# Patient Record
Sex: Female | Born: 1975 | Race: White | Hispanic: No | Marital: Married | Smoking: Never smoker
Health system: Southern US, Community
[De-identification: ages and names within clinical notes are randomized; demographics above are authoritative.]

## PROBLEM LIST (undated history)

## (undated) DIAGNOSIS — I83009 Varicose veins of unspecified lower extremity with ulcer of unspecified site: Secondary | ICD-10-CM

## (undated) DIAGNOSIS — R1013 Epigastric pain: Secondary | ICD-10-CM

## (undated) DIAGNOSIS — F329 Major depressive disorder, single episode, unspecified: Secondary | ICD-10-CM

## (undated) DIAGNOSIS — L97909 Non-pressure chronic ulcer of unspecified part of unspecified lower leg with unspecified severity: Secondary | ICD-10-CM

## (undated) DIAGNOSIS — I10 Essential (primary) hypertension: Secondary | ICD-10-CM

## (undated) DIAGNOSIS — F32A Depression, unspecified: Secondary | ICD-10-CM

## (undated) DIAGNOSIS — F419 Anxiety disorder, unspecified: Secondary | ICD-10-CM

## (undated) DIAGNOSIS — K219 Gastro-esophageal reflux disease without esophagitis: Secondary | ICD-10-CM

## (undated) DIAGNOSIS — E7800 Pure hypercholesterolemia, unspecified: Secondary | ICD-10-CM

## (undated) DIAGNOSIS — K625 Hemorrhage of anus and rectum: Secondary | ICD-10-CM

## (undated) DIAGNOSIS — E119 Type 2 diabetes mellitus without complications: Secondary | ICD-10-CM

## (undated) HISTORY — DX: Hemorrhage of anus and rectum: K62.5

## (undated) HISTORY — DX: Major depressive disorder, single episode, unspecified: F32.9

## (undated) HISTORY — DX: Depression, unspecified: F32.A

## (undated) HISTORY — DX: Pure hypercholesterolemia, unspecified: E78.00

## (undated) HISTORY — PX: CHOLECYSTECTOMY: SHX55

## (undated) HISTORY — DX: Epigastric pain: R10.13

## (undated) HISTORY — DX: Anxiety disorder, unspecified: F41.9

---

## 2014-08-11 ENCOUNTER — Encounter (HOSPITAL_COMMUNITY): Payer: Self-pay | Admitting: *Deleted

## 2014-08-11 ENCOUNTER — Emergency Department (HOSPITAL_COMMUNITY)
Admission: EM | Admit: 2014-08-11 | Discharge: 2014-08-11 | Disposition: A | Discharge status: Home / Self Care | Payer: Medicare Other | Attending: Emergency Medicine | Admitting: Emergency Medicine

## 2014-08-11 DIAGNOSIS — E1165 Type 2 diabetes mellitus with hyperglycemia: Secondary | ICD-10-CM | POA: Diagnosis not present

## 2014-08-11 DIAGNOSIS — Z9049 Acquired absence of other specified parts of digestive tract: Secondary | ICD-10-CM | POA: Diagnosis not present

## 2014-08-11 DIAGNOSIS — I10 Essential (primary) hypertension: Secondary | ICD-10-CM | POA: Diagnosis not present

## 2014-08-11 DIAGNOSIS — B373 Candidiasis of vulva and vagina: Secondary | ICD-10-CM | POA: Insufficient documentation

## 2014-08-11 DIAGNOSIS — K219 Gastro-esophageal reflux disease without esophagitis: Secondary | ICD-10-CM | POA: Diagnosis not present

## 2014-08-11 DIAGNOSIS — B3731 Acute candidiasis of vulva and vagina: Secondary | ICD-10-CM

## 2014-08-11 DIAGNOSIS — Z79899 Other long term (current) drug therapy: Secondary | ICD-10-CM | POA: Insufficient documentation

## 2014-08-11 DIAGNOSIS — R21 Rash and other nonspecific skin eruption: Secondary | ICD-10-CM | POA: Diagnosis present

## 2014-08-11 DIAGNOSIS — R739 Hyperglycemia, unspecified: Secondary | ICD-10-CM

## 2014-08-11 DIAGNOSIS — N898 Other specified noninflammatory disorders of vagina: Secondary | ICD-10-CM

## 2014-08-11 HISTORY — DX: Type 2 diabetes mellitus without complications: E11.9

## 2014-08-11 HISTORY — DX: Gastro-esophageal reflux disease without esophagitis: K21.9

## 2014-08-11 HISTORY — DX: Varicose veins of unspecified lower extremity with ulcer of unspecified site: L97.909

## 2014-08-11 HISTORY — DX: Varicose veins of unspecified lower extremity with ulcer of unspecified site: I83.009

## 2014-08-11 HISTORY — DX: Essential (primary) hypertension: I10

## 2014-08-11 LAB — URINALYSIS, ROUTINE W REFLEX MICROSCOPIC
BILIRUBIN URINE: NEGATIVE
KETONES UR: NEGATIVE mg/dL
Nitrite: NEGATIVE
PH: 5.5 (ref 5.0–8.0)
PROTEIN: NEGATIVE mg/dL
Specific Gravity, Urine: 1.037 — ABNORMAL HIGH (ref 1.005–1.030)
Urobilinogen, UA: 0.2 mg/dL (ref 0.0–1.0)

## 2014-08-11 LAB — URINE MICROSCOPIC-ADD ON

## 2014-08-11 LAB — BASIC METABOLIC PANEL
Anion gap: 11 (ref 5–15)
BUN: 13 mg/dL (ref 6–23)
CO2: 23 mmol/L (ref 19–32)
Calcium: 8.8 mg/dL (ref 8.4–10.5)
Chloride: 93 mmol/L — ABNORMAL LOW (ref 96–112)
Creatinine, Ser: 0.73 mg/dL (ref 0.50–1.10)
GFR calc Af Amer: >90 mL/min (ref >90)
GFR calc non Af Amer: >90 mL/min (ref >90)
Glucose, Bld: 488 mg/dL — ABNORMAL HIGH (ref 70–99)
Potassium: 3.5 mmol/L (ref 3.5–5.1)
Sodium: 127 mmol/L — ABNORMAL LOW (ref 135–145)

## 2014-08-11 LAB — CBC
HCT: 40.8 % (ref 36.0–46.0)
Hemoglobin: 13.5 g/dL (ref 12.0–15.0)
MCH: 27.1 pg (ref 26.0–34.0)
MCHC: 33.1 g/dL (ref 30.0–36.0)
MCV: 81.8 fL (ref 78.0–100.0)
Platelets: 268 10*3/uL (ref 150–400)
RBC: 4.99 MIL/uL (ref 3.87–5.11)
RDW: 14.6 % (ref 11.5–15.5)
WBC: 9 10*3/uL (ref 4.0–10.5)

## 2014-08-11 LAB — CBG MONITORING, ED
GLUCOSE-CAPILLARY: 507 mg/dL — AB (ref 70–99)
GLUCOSE-CAPILLARY: 380 mg/dL — AB (ref 70–99)

## 2014-08-11 LAB — WET PREP, GENITAL
Clue Cells Wet Prep HPF POC: NONE SEEN
Trich, Wet Prep: NONE SEEN

## 2014-08-11 LAB — GC/CHLAMYDIA PROBE AMP (~~LOC~~) NOT AT ARMC
CHLAMYDIA, DNA PROBE: NEGATIVE
Neisseria Gonorrhea: NEGATIVE

## 2014-08-11 LAB — PREGNANCY, URINE: Preg Test, Ur: NEGATIVE

## 2014-08-11 MED ORDER — SODIUM CHLORIDE 0.9 % IV BOLUS (SEPSIS): 1000.0000 mL | Freq: Once | INTRAVENOUS | Status: DC

## 2014-08-11 MED ORDER — SODIUM CHLORIDE 0.9 % IV BOLUS (SEPSIS)
1000.0000 mL | Freq: Once | INTRAVENOUS | Status: AC
  Administered 2014-08-11: 1000 mL via INTRAVENOUS

## 2014-08-11 MED ORDER — DIPHENHYDRAMINE HCL 25 MG PO CAPS
25.0000 mg | ORAL_CAPSULE | Freq: Once | ORAL | Status: AC
  Administered 2014-08-11: 25 mg via ORAL
  Filled 2014-08-11: qty 1

## 2014-08-11 MED ORDER — AZITHROMYCIN 250 MG PO TABS
1000.0000 mg | ORAL_TABLET | Freq: Once | ORAL | Status: AC
  Administered 2014-08-11: 1000 mg via ORAL
  Filled 2014-08-11: qty 4

## 2014-08-11 MED ORDER — NYSTATIN 100000 UNIT/GM EX CREA: TOPICAL_CREAM | CUTANEOUS | Status: DC

## 2014-08-11 MED ORDER — CEFTRIAXONE SODIUM 250 MG IJ SOLR
250.0000 mg | Freq: Once | INTRAMUSCULAR | Status: AC
  Administered 2014-08-11: 250 mg via INTRAMUSCULAR
  Filled 2014-08-11: qty 250

## 2014-08-11 MED ORDER — FLUCONAZOLE 150 MG PO TABS
150.0000 mg | ORAL_TABLET | Freq: Once | ORAL | Status: AC
  Administered 2014-08-11: 150 mg via ORAL
  Filled 2014-08-11: qty 1

## 2014-08-11 MED ORDER — LIDOCAINE HCL (PF) 1 % IJ SOLN
0.9000 mL | Freq: Once | INTRAMUSCULAR | Status: AC
  Administered 2014-08-11: 0.9 mL
  Filled 2014-08-11: qty 5

## 2014-08-11 NOTE — Discharge Instructions (Signed)
Please follow up with your primary care physician in 1-2 days. If you do not have one please call [facility] number listed above. Please take your medications as prescribed. Please use Nystatin for your rash. Please use your Metformin as prescribed. Please read all discharge instructions and return precautions.    Candidal Vulvovaginitis Candidal vulvovaginitis is an infection of the vagina and vulva. The vulva is the skin around the opening of the vagina. This may cause itching and discomfort in and around the vagina.  HOME CARE  Only take medicine as told by your doctor.  Do not have sex (intercourse) until the infection is healed or as told by your doctor.  Practice safe sex.  Tell your sex partner about your infection.  Do not douche or use tampons.  Wear cotton underwear. Do not wear tight pants or panty hose.  Eat yogurt. This may help treat and prevent yeast infections. GET HELP RIGHT AWAY IF:   You have a fever.  Your problems get worse during treatment or do not get better in 3 days.  You have discomfort, irritation, or itching in your vagina or vulva area.  You have pain after sex.  You start to get belly (abdominal) pain. MAKE SURE YOU:  Understand these instructions.  Will watch your condition.  Will get help right away if you are not doing well or get worse. Document Released: 09/16/2008 Document Revised: 06/25/2013 Document Reviewed: 09/16/2008 [facility] Patient Information 2015 [place]. This information is not intended to replace advice given to you by your health care provider. Make sure you discuss any questions you have with your health care provider. Hyperglycemia Hyperglycemia occurs when the glucose (sugar) in your blood is too high. Hyperglycemia can happen for many reasons, but it most often happens to people who do not know they have diabetes or are not managing their diabetes properly.  CAUSES  Whether you have  diabetes or not, there are other causes of hyperglycemia. Hyperglycemia can occur when you have diabetes, but it can also occur in other situations that you might not be as aware of, such as: Diabetes  If you have diabetes and are having problems controlling your blood glucose, hyperglycemia could occur because of some of the following reasons:  Not following your meal plan.  Not taking your diabetes medications or not taking it properly.  Exercising less or doing less activity than you normally do.  Being sick. Pre-diabetes  This cannot be ignored. Before people develop Type 2 diabetes, they almost always have "pre-diabetes." This is when your blood glucose levels are higher than normal, but not yet high enough to be diagnosed as diabetes. Research has shown that some long-term damage to the body, especially the heart and circulatory system, may already be occurring during pre-diabetes. If you take action to manage your blood glucose when you have pre-diabetes, you may delay or prevent Type 2 diabetes from developing. Stress  If you have diabetes, you may be "diet" controlled or on oral medications or insulin to control your diabetes. However, you may find that your blood glucose is higher than usual in the hospital whether you have diabetes or not. This is often referred to as "stress hyperglycemia." Stress can elevate your blood glucose. This happens because of hormones put out by the body during times of stress. If stress has been the cause of your high blood glucose, it can be followed regularly by your caregiver. That way he/she can make sure  your hyperglycemia does not continue to get worse or progress to diabetes. Steroids  Steroids are medications that act on the infection fighting system (immune system) to block inflammation or infection. One side effect can be a rise in blood glucose. Most people can produce enough extra insulin to allow for this rise, but for those who cannot,  steroids make blood glucose levels go even higher. It is not unusual for steroid treatments to "uncover" diabetes that is developing. It is not always possible to determine if the hyperglycemia will go away after the steroids are stopped. A special blood test called an A1c is sometimes done to determine if your blood glucose was elevated before the steroids were started. SYMPTOMS  Thirsty.  Frequent urination.  Dry mouth.  Blurred vision.  Tired or fatigue.  Weakness.  Sleepy.  Tingling in feet or leg. DIAGNOSIS  Diagnosis is made by monitoring blood glucose in one or all of the following ways:  A1c test. This is a chemical found in your blood.  Fingerstick blood glucose monitoring.  Laboratory results. TREATMENT  First, knowing the cause of the hyperglycemia is important before the hyperglycemia can be treated. Treatment may include, but is not be limited to:  Education.  Change or adjustment in medications.  Change or adjustment in meal plan.  Treatment for an illness, infection, etc.  More frequent blood glucose monitoring.  Change in exercise plan.  Decreasing or stopping steroids.  Lifestyle changes. HOME CARE INSTRUCTIONS   Test your blood glucose as directed.  Exercise regularly. Your caregiver will give you instructions about exercise. Pre-diabetes or diabetes which comes on with stress is helped by exercising.  Eat wholesome, balanced meals. Eat often and at regular, fixed times. Your caregiver or nutritionist will give you a meal plan to guide your sugar intake.  Being at an ideal weight is important. If needed, losing as little as 10 to 15 pounds may help improve blood glucose levels. SEEK MEDICAL CARE IF:   You have questions about medicine, activity, or diet.  You continue to have symptoms (problems such as increased thirst, urination, or weight gain). SEEK IMMEDIATE MEDICAL CARE IF:   You are vomiting or have diarrhea.  Your breath smells  fruity.  You are breathing faster or slower.  You are very sleepy or incoherent.  You have numbness, tingling, or pain in your feet or hands.  You have chest pain.  Your symptoms get worse even though you have been following your caregiver's orders.  If you have any other questions or concerns. Document Released: 12/14/2000 Document Revised: 09/12/2011 Document Reviewed: 10/17/2011 [facility] Patient Information 2015 [place], [place]. This information is not intended to replace advice given to you by your health care provider. Make sure you discuss any questions you have with your health care provider.

## 2014-08-11 NOTE — ED Notes (Signed)
Pt reports rectal irritation and burning that began last Monday, progressively worse - pt unsure if there is a rash to the rectal area. Also c/o dry mouth and a dry/swollen upper lip. Pt used corn starch to the area w/o relief.

## 2014-08-11 NOTE — ED Provider Notes (Signed)
CSN: [id number]     Arrival date & time 08/11/14  0028 History   First MD Initiated Contact with Patient 08/11/14 0100     Chief Complaint  Patient presents with  . Rash     (Consider location/radiation/quality/duration/timing/severity/associated sxs/prior Treatment) HPI Comments: Patient is a 39 year old female past medical history significant for hypertension, DM, GERD presenting to the emergency department for multiple complaints. Patient's first complaint is a pruritic rash to the ano-genital area that has been worsening over the last week. She states the redness and itching have become more intense. She has tried corn starch, benadryl, and "edible" lotion with no improvement.   She is also complaining of dry mouth, polyuria, polydipsia x 1 week. Patient states she typically checks her blood sugar at home every 2-3 days and her glucose levels are typically in the 100s-200s. She denies missing any doses of her metformin.  Patient is also complaining of white foul-smelling vaginal discharge for the last few days. Denies any recent unprotected sexual intercourse. She states her last menstrual period was the beginning of January. No history of pregnancies.  Patient is also complaining of one week of upper lip swelling. Denies any shortness of breath, tongue swelling, difficulty breathing, difficulty swallowing, nausea, vomiting, diarrhea. No new medications, soaps, detergents etc.  Patient is a 39 y.o. female presenting with rash. The history is provided by the patient.  Rash Location:  Ano-genital Ano-genital rash location:  Gluteal cleft, groin, vagina, vulva and perineum Quality: burning, dryness, itchiness and redness   Quality: not blistering, not draining, not scaling, not swelling and not weeping   Associated symptoms: no abdominal pain, no diarrhea, no nausea and not vomiting     Past Medical History  Diagnosis Date  . Venous stasis ulcer   . Diabetes mellitus without  complication   . Hypertension   . GERD (gastroesophageal reflux disease)    Past Surgical History  Procedure Laterality Date  . Cholecystectomy     History reviewed. No pertinent family history. History  Substance Use Topics  . Smoking status: Never Smoker   . Smokeless tobacco: Not on file  . Alcohol Use: No   OB History    No data available     Review of Systems  Gastrointestinal: Negative for nausea, vomiting, abdominal pain and diarrhea.  Genitourinary: Positive for vaginal discharge.  Skin: Positive for rash.  All other systems reviewed and are negative.     Allergies  Other  Home Medications   Prior to Admission medications   Medication Sig Start Date End Date Taking? Authorizing Provider  FLUoxetine (PROZAC) 20 MG capsule Take 20 mg by mouth daily.   Yes Historical Provider, MD  HYDROcodone-acetaminophen (NORCO/VICODIN) 5-325 MG per tablet Take 1 tablet by mouth every 6 (six) hours as needed for moderate pain.   Yes Historical Provider, MD  metFORMIN (GLUCOPHAGE) 500 MG tablet Take 500 mg by mouth 2 (two) times daily with a meal.   Yes Historical Provider, MD  pantoprazole (PROTONIX) 20 MG tablet Take 20 mg by mouth daily.   Yes Historical Provider, MD  pravastatin (PRAVACHOL) 10 MG tablet Take 10 mg by mouth daily.   Yes Historical Provider, MD  nystatin cream (MYCOSTATIN) Apply to affected area 2 times daily 08/11/14   Victorino Dike L Jamicah Anstead, PA-C   BP 114/67 mmHg  Pulse 95  Temp(Src) 97.6 F (36.4 C) (Oral)  Resp 18  Ht  (1.753 m)  SpO2 98%  LMP 07/11/2014 (Approximate) Physical Exam  Constitutional:  She is oriented to person, place, and time. She appears well-developed and well-nourished. No distress.  HENT:  Head: Normocephalic and atraumatic.  Right Ear: External ear normal.  Left Ear: External ear normal.  Nose: Nose normal.  Mouth/Throat: Oropharynx is clear and moist.  No angioedema.  Eyes: Conjunctivae are normal.  Neck: Normal range of  motion. Neck supple.  Cardiovascular: Normal rate, regular rhythm and normal heart sounds.   Pulmonary/Chest: Effort normal and breath sounds normal. No respiratory distress.  Abdominal: Soft. Bowel sounds are normal. She exhibits no distension. There is no tenderness. There is no rebound and no guarding.  Musculoskeletal: Normal range of motion.  Neurological: She is alert and oriented to person, place, and time.  Skin: Skin is warm and dry. Rash (vulvovaginal erythema ) noted. No petechiae and no purpura noted. Rash is not nodular, not pustular, not vesicular and not urticarial. She is not diaphoretic.  Psychiatric: She has a normal mood and affect.  Nursing note and vitals reviewed.  Exam performed by Francee PiccoloPIEPENBRINK, Byrdie Miyazaki L,  exam chaperoned Date: 08/11/2014 Pelvic exam: normal external genitalia without evidence of trauma. VULVA: normal appearing vulva with no masses, tenderness or lesion. VAGINA: normal appearing vagina with normal color and discharge, no lesions. CERVIX: normal appearing cervix without lesions, cervical motion tenderness absent, cervical os closed with out purulent discharge; vaginal discharge - white and thick, Wet prep and DNA probe for chlamydia and GC obtained.   ADNEXA: normal adnexa in size, nontender and no masses UTERUS: uterus is normal size, shape, consistency and nontender.   ED Course  Procedures (including critical care time) Medications  diphenhydrAMINE (BENADRYL) capsule 25 mg (25 mg Oral Given 08/11/14 0235)  sodium chloride 0.9 % bolus 1,000 mL (0 mLs Intravenous Stopped 08/11/14 0400)  fluconazole (DIFLUCAN) tablet 150 mg (150 mg Oral Given 08/11/14 0440)  cefTRIAXone (ROCEPHIN) injection 250 mg (250 mg Intramuscular Given 08/11/14 0440)  azithromycin (ZITHROMAX) tablet 1,000 mg (1,000 mg Oral Given 08/11/14 0440)  lidocaine (PF) (XYLOCAINE) 1 % injection 0.9 mL (0.9 mLs Other Given 08/11/14 0441)    Labs Review Labs Reviewed  WET PREP, GENITAL -  Abnormal; Notable for the following:    Yeast Wet Prep HPF POC FEW (*)    WBC, Wet Prep HPF POC MODERATE (*)    All other components within normal limits  URINALYSIS, ROUTINE W REFLEX MICROSCOPIC - Abnormal; Notable for the following:    APPearance HAZY (*)    Specific Gravity, Urine 1.037 (*)    Glucose, UA >1000 (*)    Hgb urine dipstick SMALL (*)    Leukocytes, UA SMALL (*)    All other components within normal limits  BASIC METABOLIC PANEL - Abnormal; Notable for the following:    Sodium 127 (*)    Chloride 93 (*)    Glucose, Bld 488 (*)    All other components within normal limits  CBG MONITORING, ED - Abnormal; Notable for the following:    Glucose-Capillary 507 (*)    All other components within normal limits  CBG MONITORING, ED - Abnormal; Notable for the following:    Glucose-Capillary 380 (*)    All other components within normal limits  PREGNANCY, URINE  CBC  URINE MICROSCOPIC-ADD ON  GC/CHLAMYDIA PROBE AMP ([facility])    Imaging Review No results found.   EKG Interpretation None      MDM   Final diagnoses:  Hyperglycemia without ketosis  Vulvovaginal candidiasis  Vaginal discharge    Filed Vitals:  08/11/14 0503  BP: 114/67  Pulse: 95  Temp:   Resp: 18   Afebrile, NAD, non-toxic appearing, AAOx4.  I have reviewed nursing notes, vital signs, and all appropriate lab and imaging results for this patient.  1) Vulvovaginal candidiasis: Patient with candidal infection will prescribe nystatin for external infection, we'll give 150 mg of Diflucan here in emergency department for vaginal candidal infection. Advised OB/GYN follow-up.  2) Hyperglycemia: Patient noted to be hyperglycemic in emergency department. He had Within normal limits. No ketones in urine. Glucose decreased in response to IV fluids. Discussed proper control at home with the use of indications as prescribed along with dietary changes.  3) Vaginal discharge: Patient to be discharged  with instructions to follow up with OBGYN. Pt understands GC/Chlamydia cultures pending and that they will need to inform all sexual partners within the last 6 months if results return positive. Pt has been treated prophylacticly with azithromycin and rocephin due to pts history, pelvic exam, and wet prep with increased WBCs. Pt advised that she will receive a call in 48 hours if the test is positive and to refrain from sexual activity for 48 hours. If the test is positive, pt is advised to refrain from sexual activity for 10 days for the medicine to take effect.  Pt not concerning for PID because hemodynamically stable and no cervical motion tenderness on pelvic exam.  Return precautions discussed. Patient is agreeable to plan. Patient is stable at time of discharge.      Jeannetta Ellis, PA-C 08/11/14 [id number]  Gwyneth Sprout, MD 08/11/14 [phone]

## 2014-08-11 NOTE — ED Notes (Signed)
Pt had no adverse reaction to ABT IM.

## 2014-08-11 NOTE — ED Notes (Signed)
Awake. Verbally responsive. A/O x4. Resp even and unlabored. No audible adventitious breath sounds noted. ABC's intact.  

## 2014-09-18 ENCOUNTER — Encounter (HOSPITAL_BASED_OUTPATIENT_CLINIC_OR_DEPARTMENT_OTHER): Payer: Medicare Other | Attending: Internal Medicine

## 2014-09-18 DIAGNOSIS — L97211 Non-pressure chronic ulcer of right calf limited to breakdown of skin: Secondary | ICD-10-CM | POA: Insufficient documentation

## 2014-09-18 DIAGNOSIS — E1165 Type 2 diabetes mellitus with hyperglycemia: Secondary | ICD-10-CM | POA: Insufficient documentation

## 2014-09-18 DIAGNOSIS — E1151 Type 2 diabetes mellitus with diabetic peripheral angiopathy without gangrene: Secondary | ICD-10-CM | POA: Diagnosis not present

## 2014-09-18 LAB — GLUCOSE, CAPILLARY: Glucose-Capillary: 394 mg/dL — ABNORMAL HIGH (ref 70–99)

## 2014-09-24 ENCOUNTER — Ambulatory Visit (HOSPITAL_COMMUNITY)
Admission: RE | Admit: 2014-09-24 | Discharge: 2014-09-24 | Disposition: A | Discharge status: Home / Self Care | Payer: Medicare Other | Source: Ambulatory Visit | Attending: Vascular Surgery | Admitting: Vascular Surgery

## 2014-09-24 ENCOUNTER — Other Ambulatory Visit: Payer: Self-pay | Admitting: Internal Medicine

## 2014-09-24 DIAGNOSIS — L97211 Non-pressure chronic ulcer of right calf limited to breakdown of skin: Secondary | ICD-10-CM

## 2014-09-24 DIAGNOSIS — I10 Essential (primary) hypertension: Secondary | ICD-10-CM | POA: Insufficient documentation

## 2014-09-24 DIAGNOSIS — E785 Hyperlipidemia, unspecified: Secondary | ICD-10-CM | POA: Diagnosis not present

## 2014-09-24 DIAGNOSIS — E119 Type 2 diabetes mellitus without complications: Secondary | ICD-10-CM | POA: Diagnosis not present

## 2014-09-25 DIAGNOSIS — L97211 Non-pressure chronic ulcer of right calf limited to breakdown of skin: Secondary | ICD-10-CM | POA: Diagnosis not present

## 2014-09-25 DIAGNOSIS — E1165 Type 2 diabetes mellitus with hyperglycemia: Secondary | ICD-10-CM | POA: Diagnosis not present

## 2014-09-25 DIAGNOSIS — E1151 Type 2 diabetes mellitus with diabetic peripheral angiopathy without gangrene: Secondary | ICD-10-CM | POA: Diagnosis not present

## 2014-09-25 LAB — GLUCOSE, CAPILLARY: GLUCOSE-CAPILLARY: 510 mg/dL — AB (ref 70–99)

## 2014-10-02 DIAGNOSIS — E1165 Type 2 diabetes mellitus with hyperglycemia: Secondary | ICD-10-CM | POA: Diagnosis not present

## 2014-10-02 DIAGNOSIS — E1151 Type 2 diabetes mellitus with diabetic peripheral angiopathy without gangrene: Secondary | ICD-10-CM | POA: Diagnosis not present

## 2014-10-02 DIAGNOSIS — L97211 Non-pressure chronic ulcer of right calf limited to breakdown of skin: Secondary | ICD-10-CM | POA: Diagnosis not present

## 2014-10-09 ENCOUNTER — Encounter (HOSPITAL_BASED_OUTPATIENT_CLINIC_OR_DEPARTMENT_OTHER): Payer: Medicare Other | Attending: Internal Medicine

## 2014-10-09 DIAGNOSIS — E11622 Type 2 diabetes mellitus with other skin ulcer: Secondary | ICD-10-CM | POA: Insufficient documentation

## 2014-10-09 DIAGNOSIS — I872 Venous insufficiency (chronic) (peripheral): Secondary | ICD-10-CM | POA: Insufficient documentation

## 2014-10-09 DIAGNOSIS — L97211 Non-pressure chronic ulcer of right calf limited to breakdown of skin: Secondary | ICD-10-CM | POA: Diagnosis not present

## 2014-10-09 DIAGNOSIS — E1151 Type 2 diabetes mellitus with diabetic peripheral angiopathy without gangrene: Secondary | ICD-10-CM | POA: Insufficient documentation

## 2014-10-09 DIAGNOSIS — I10 Essential (primary) hypertension: Secondary | ICD-10-CM | POA: Diagnosis not present

## 2014-10-09 DIAGNOSIS — J45909 Unspecified asthma, uncomplicated: Secondary | ICD-10-CM | POA: Diagnosis not present

## 2014-10-09 LAB — GLUCOSE, CAPILLARY: Glucose-Capillary: 368 mg/dL — ABNORMAL HIGH (ref 70–99)

## 2014-10-16 DIAGNOSIS — L97211 Non-pressure chronic ulcer of right calf limited to breakdown of skin: Secondary | ICD-10-CM | POA: Diagnosis not present

## 2014-10-16 DIAGNOSIS — E11622 Type 2 diabetes mellitus with other skin ulcer: Secondary | ICD-10-CM | POA: Diagnosis not present

## 2014-10-16 DIAGNOSIS — E1151 Type 2 diabetes mellitus with diabetic peripheral angiopathy without gangrene: Secondary | ICD-10-CM | POA: Diagnosis not present

## 2014-10-16 DIAGNOSIS — I872 Venous insufficiency (chronic) (peripheral): Secondary | ICD-10-CM | POA: Diagnosis not present

## 2014-10-23 DIAGNOSIS — E1151 Type 2 diabetes mellitus with diabetic peripheral angiopathy without gangrene: Secondary | ICD-10-CM | POA: Diagnosis not present

## 2014-10-23 DIAGNOSIS — L97211 Non-pressure chronic ulcer of right calf limited to breakdown of skin: Secondary | ICD-10-CM | POA: Diagnosis not present

## 2014-10-23 DIAGNOSIS — I872 Venous insufficiency (chronic) (peripheral): Secondary | ICD-10-CM | POA: Diagnosis not present

## 2014-10-23 DIAGNOSIS — E11622 Type 2 diabetes mellitus with other skin ulcer: Secondary | ICD-10-CM | POA: Diagnosis not present

## 2014-10-30 DIAGNOSIS — I872 Venous insufficiency (chronic) (peripheral): Secondary | ICD-10-CM | POA: Diagnosis not present

## 2014-10-31 ENCOUNTER — Encounter: Payer: Medicare Other | Attending: Family Medicine | Admitting: Skilled Nursing Facility1

## 2014-10-31 ENCOUNTER — Encounter: Payer: Self-pay | Admitting: Skilled Nursing Facility1

## 2014-10-31 VITALS — Ht 69.0 in | Wt 259.0 lb

## 2014-10-31 DIAGNOSIS — Z713 Dietary counseling and surveillance: Secondary | ICD-10-CM | POA: Insufficient documentation

## 2014-10-31 DIAGNOSIS — E119 Type 2 diabetes mellitus without complications: Secondary | ICD-10-CM | POA: Diagnosis not present

## 2014-10-31 NOTE — Progress Notes (Signed)
Diabetes Self-Management Education  Visit Type:    Appt. Start Time: 10:30 Appt. End Time: 12:00  10/31/2014  Ms. Taylor Patterson, identified by name and date of birth, is a 39 y.o. female with a diagnosis of Diabetes: Type 2.  Other people present during visit:      ASSESSMENT  Height 5\' 9"  (1.753 m), weight 259 lb (117.482 kg). Body mass index is 38.23 kg/(m^2).   Pt states she has had the leg ulcer for 10 years and has not seen it heal. Pt states it hinders her from being as active as she wants to be. Pt states she was 300 pounds but lost wt due to little nutritional changes. Pt states she has had blurred vision every day for a month and has had a headache every day for a week.   Initial Visit Information:  Are you currently following a meal plan?: No   Are you taking your medications as prescribed?: Yes Are you checking your feet?: Yes How many days per week are you checking your feet?: 7      Psychosocial:     Patient Belief/Attitude about Diabetes: Denial Self-care barriers: None Self-management support: Family Patient Concerns: Nutrition/Meal planning, Healthy Lifestyle, Weight Control Special Needs: None Preferred Learning Style: Auditory Learning Readiness: Contemplating  Complications:   Last HgB A1C per patient/outside source: 16 mg/dL How often do you check your blood sugar?: 1-2 times/day Fasting Blood glucose range (mg/dL): 81-19170-129 Postprandial Blood glucose range (mg/dL): [phone] Number of hypoglycemic episodes per month: 0 Number of hyperglycemic episodes per week: 1 Have you had a dilated eye exam in the past 12 months?: No Have you had a dental exam in the past 12 months?: No  Diet Intake:  Breakfast: toast, orange Snack (morning): fruit or nuts Lunch: salad (lettuce, spinach, cucmber, carrot, tomato) Snack (afternoon): fruit or nuts Dinner: chicken, green beans or lima beans Snack (evening): fruit or nuts Beverage(s): avoids water, half and  half tea,   Exercise:  Exercise: ADL's, Light (walking / raking leaves) (Leg ulcer prevents her from more walking)  Individualized Plan for Diabetes Self-Management Training:   Learning Objective:  Patient will have a greater understanding of diabetes self-management.  Patient education plan per assessed needs and concerns is to attend individual sessions for     Education Topics Reviewed with Patient Today:  Definition of diabetes, type 1 and 2, and the diagnosis of diabetes Role of diet in the treatment of diabetes and the relationship between the three main macronutrients and blood glucose level, Food label reading, portion sizes and measuring food., Carbohydrate counting Role of exercise on diabetes management, blood pressure control and cardiac health., Identified with patient nutritional and/or medication changes necessary with exercise.   Daily foot exams, Yearly dilated eye exam Discussed and identified patients' treatment of hyperglycemia. Relationship between chronic complications and blood glucose control, Dental care, Retinopathy and reason for yearly dilated eye exams        PATIENTS GOALS/Plan (Developed by the patient):  Nutrition: Follow meal plan discussed, General guidelines for healthy choices and portions discussed, Adjust meds/carbs with exercise as discussed Physical Activity: Exercise 5-7 days per week  Plan:   There are no Patient Instructions on file for this visit.  Expected Outcomes:  Demonstrated interest in learning. Expect positive outcomes  Education material provided: Living Well with Diabetes, Meal plan card and Snack sheet  If problems or questions, patient to contact team via:  Phone  Future DSME appointment: PRN

## 2014-11-06 ENCOUNTER — Encounter (HOSPITAL_BASED_OUTPATIENT_CLINIC_OR_DEPARTMENT_OTHER): Payer: Medicare Other | Attending: Internal Medicine

## 2014-11-06 DIAGNOSIS — L97211 Non-pressure chronic ulcer of right calf limited to breakdown of skin: Secondary | ICD-10-CM | POA: Insufficient documentation

## 2014-11-06 DIAGNOSIS — I872 Venous insufficiency (chronic) (peripheral): Secondary | ICD-10-CM | POA: Diagnosis not present

## 2014-11-13 DIAGNOSIS — I872 Venous insufficiency (chronic) (peripheral): Secondary | ICD-10-CM | POA: Diagnosis not present

## 2014-11-20 DIAGNOSIS — I872 Venous insufficiency (chronic) (peripheral): Secondary | ICD-10-CM | POA: Diagnosis not present

## 2014-11-20 DIAGNOSIS — L97211 Non-pressure chronic ulcer of right calf limited to breakdown of skin: Secondary | ICD-10-CM | POA: Diagnosis not present

## 2014-11-27 DIAGNOSIS — I872 Venous insufficiency (chronic) (peripheral): Secondary | ICD-10-CM | POA: Diagnosis not present

## 2014-12-04 ENCOUNTER — Encounter (HOSPITAL_BASED_OUTPATIENT_CLINIC_OR_DEPARTMENT_OTHER): Payer: Medicare Other | Attending: Internal Medicine

## 2014-12-04 DIAGNOSIS — I10 Essential (primary) hypertension: Secondary | ICD-10-CM | POA: Diagnosis not present

## 2014-12-04 DIAGNOSIS — J45909 Unspecified asthma, uncomplicated: Secondary | ICD-10-CM | POA: Diagnosis not present

## 2014-12-04 DIAGNOSIS — L97211 Non-pressure chronic ulcer of right calf limited to breakdown of skin: Secondary | ICD-10-CM | POA: Diagnosis not present

## 2014-12-04 DIAGNOSIS — E1151 Type 2 diabetes mellitus with diabetic peripheral angiopathy without gangrene: Secondary | ICD-10-CM | POA: Insufficient documentation

## 2014-12-04 DIAGNOSIS — I872 Venous insufficiency (chronic) (peripheral): Secondary | ICD-10-CM | POA: Insufficient documentation

## 2014-12-04 DIAGNOSIS — E11622 Type 2 diabetes mellitus with other skin ulcer: Secondary | ICD-10-CM | POA: Insufficient documentation

## 2014-12-11 DIAGNOSIS — E1151 Type 2 diabetes mellitus with diabetic peripheral angiopathy without gangrene: Secondary | ICD-10-CM | POA: Diagnosis not present

## 2014-12-18 DIAGNOSIS — E1151 Type 2 diabetes mellitus with diabetic peripheral angiopathy without gangrene: Secondary | ICD-10-CM | POA: Diagnosis not present

## 2014-12-18 DIAGNOSIS — L97211 Non-pressure chronic ulcer of right calf limited to breakdown of skin: Secondary | ICD-10-CM | POA: Diagnosis not present

## 2014-12-18 DIAGNOSIS — E11622 Type 2 diabetes mellitus with other skin ulcer: Secondary | ICD-10-CM | POA: Diagnosis not present

## 2014-12-18 DIAGNOSIS — I872 Venous insufficiency (chronic) (peripheral): Secondary | ICD-10-CM | POA: Diagnosis not present

## 2014-12-25 DIAGNOSIS — E1151 Type 2 diabetes mellitus with diabetic peripheral angiopathy without gangrene: Secondary | ICD-10-CM | POA: Diagnosis not present

## 2014-12-25 DIAGNOSIS — E11622 Type 2 diabetes mellitus with other skin ulcer: Secondary | ICD-10-CM | POA: Diagnosis not present

## 2014-12-25 DIAGNOSIS — L97211 Non-pressure chronic ulcer of right calf limited to breakdown of skin: Secondary | ICD-10-CM | POA: Diagnosis not present

## 2014-12-25 DIAGNOSIS — I872 Venous insufficiency (chronic) (peripheral): Secondary | ICD-10-CM | POA: Diagnosis not present

## 2015-01-01 DIAGNOSIS — E11622 Type 2 diabetes mellitus with other skin ulcer: Secondary | ICD-10-CM | POA: Diagnosis not present

## 2015-01-01 DIAGNOSIS — L97211 Non-pressure chronic ulcer of right calf limited to breakdown of skin: Secondary | ICD-10-CM | POA: Diagnosis not present

## 2015-01-01 DIAGNOSIS — E1151 Type 2 diabetes mellitus with diabetic peripheral angiopathy without gangrene: Secondary | ICD-10-CM | POA: Diagnosis not present

## 2015-01-01 DIAGNOSIS — I872 Venous insufficiency (chronic) (peripheral): Secondary | ICD-10-CM | POA: Diagnosis not present

## 2015-01-08 ENCOUNTER — Encounter (HOSPITAL_BASED_OUTPATIENT_CLINIC_OR_DEPARTMENT_OTHER): Payer: Commercial Managed Care - HMO | Attending: Internal Medicine

## 2015-01-08 DIAGNOSIS — I10 Essential (primary) hypertension: Secondary | ICD-10-CM | POA: Diagnosis not present

## 2015-01-08 DIAGNOSIS — I739 Peripheral vascular disease, unspecified: Secondary | ICD-10-CM | POA: Insufficient documentation

## 2015-01-08 DIAGNOSIS — E0851 Diabetes mellitus due to underlying condition with diabetic peripheral angiopathy without gangrene: Secondary | ICD-10-CM | POA: Diagnosis not present

## 2015-01-08 DIAGNOSIS — L03115 Cellulitis of right lower limb: Secondary | ICD-10-CM | POA: Diagnosis not present

## 2015-01-08 DIAGNOSIS — L97211 Non-pressure chronic ulcer of right calf limited to breakdown of skin: Secondary | ICD-10-CM | POA: Insufficient documentation

## 2015-01-13 DIAGNOSIS — L98499 Non-pressure chronic ulcer of skin of other sites with unspecified severity: Secondary | ICD-10-CM | POA: Diagnosis not present

## 2015-01-13 DIAGNOSIS — E1165 Type 2 diabetes mellitus with hyperglycemia: Secondary | ICD-10-CM | POA: Diagnosis not present

## 2015-01-13 DIAGNOSIS — E785 Hyperlipidemia, unspecified: Secondary | ICD-10-CM | POA: Diagnosis not present

## 2015-01-13 DIAGNOSIS — F329 Major depressive disorder, single episode, unspecified: Secondary | ICD-10-CM | POA: Diagnosis not present

## 2015-01-13 DIAGNOSIS — R7989 Other specified abnormal findings of blood chemistry: Secondary | ICD-10-CM | POA: Diagnosis not present

## 2015-01-13 DIAGNOSIS — E669 Obesity, unspecified: Secondary | ICD-10-CM | POA: Diagnosis not present

## 2015-01-15 DIAGNOSIS — E0851 Diabetes mellitus due to underlying condition with diabetic peripheral angiopathy without gangrene: Secondary | ICD-10-CM | POA: Diagnosis not present

## 2015-01-15 DIAGNOSIS — I10 Essential (primary) hypertension: Secondary | ICD-10-CM | POA: Diagnosis not present

## 2015-01-15 DIAGNOSIS — L97211 Non-pressure chronic ulcer of right calf limited to breakdown of skin: Secondary | ICD-10-CM | POA: Diagnosis not present

## 2015-01-15 DIAGNOSIS — L03115 Cellulitis of right lower limb: Secondary | ICD-10-CM | POA: Diagnosis not present

## 2015-01-15 DIAGNOSIS — I739 Peripheral vascular disease, unspecified: Secondary | ICD-10-CM | POA: Diagnosis not present

## 2015-01-20 DIAGNOSIS — L259 Unspecified contact dermatitis, unspecified cause: Secondary | ICD-10-CM | POA: Diagnosis not present

## 2015-01-22 DIAGNOSIS — I10 Essential (primary) hypertension: Secondary | ICD-10-CM | POA: Diagnosis not present

## 2015-01-22 DIAGNOSIS — I739 Peripheral vascular disease, unspecified: Secondary | ICD-10-CM | POA: Diagnosis not present

## 2015-01-22 DIAGNOSIS — L03115 Cellulitis of right lower limb: Secondary | ICD-10-CM | POA: Diagnosis not present

## 2015-01-22 DIAGNOSIS — L97211 Non-pressure chronic ulcer of right calf limited to breakdown of skin: Secondary | ICD-10-CM | POA: Diagnosis not present

## 2015-01-22 DIAGNOSIS — E0851 Diabetes mellitus due to underlying condition with diabetic peripheral angiopathy without gangrene: Secondary | ICD-10-CM | POA: Diagnosis not present

## 2015-01-29 DIAGNOSIS — E0851 Diabetes mellitus due to underlying condition with diabetic peripheral angiopathy without gangrene: Secondary | ICD-10-CM | POA: Diagnosis not present

## 2015-01-29 DIAGNOSIS — L03115 Cellulitis of right lower limb: Secondary | ICD-10-CM | POA: Diagnosis not present

## 2015-01-29 DIAGNOSIS — L97211 Non-pressure chronic ulcer of right calf limited to breakdown of skin: Secondary | ICD-10-CM | POA: Diagnosis not present

## 2015-01-29 DIAGNOSIS — F331 Major depressive disorder, recurrent, moderate: Secondary | ICD-10-CM | POA: Diagnosis not present

## 2015-01-29 DIAGNOSIS — I10 Essential (primary) hypertension: Secondary | ICD-10-CM | POA: Diagnosis not present

## 2015-01-29 DIAGNOSIS — I739 Peripheral vascular disease, unspecified: Secondary | ICD-10-CM | POA: Diagnosis not present

## 2015-02-05 ENCOUNTER — Encounter (HOSPITAL_BASED_OUTPATIENT_CLINIC_OR_DEPARTMENT_OTHER): Payer: Commercial Managed Care - HMO | Attending: Internal Medicine

## 2015-02-05 DIAGNOSIS — E11622 Type 2 diabetes mellitus with other skin ulcer: Secondary | ICD-10-CM | POA: Insufficient documentation

## 2015-02-05 DIAGNOSIS — Z6838 Body mass index (BMI) 38.0-38.9, adult: Secondary | ICD-10-CM | POA: Insufficient documentation

## 2015-02-05 DIAGNOSIS — E1151 Type 2 diabetes mellitus with diabetic peripheral angiopathy without gangrene: Secondary | ICD-10-CM | POA: Insufficient documentation

## 2015-02-05 DIAGNOSIS — J45909 Unspecified asthma, uncomplicated: Secondary | ICD-10-CM | POA: Insufficient documentation

## 2015-02-05 DIAGNOSIS — F331 Major depressive disorder, recurrent, moderate: Secondary | ICD-10-CM | POA: Diagnosis not present

## 2015-02-05 DIAGNOSIS — E669 Obesity, unspecified: Secondary | ICD-10-CM | POA: Insufficient documentation

## 2015-02-05 DIAGNOSIS — I83018 Varicose veins of right lower extremity with ulcer other part of lower leg: Secondary | ICD-10-CM | POA: Diagnosis not present

## 2015-02-05 DIAGNOSIS — I10 Essential (primary) hypertension: Secondary | ICD-10-CM | POA: Insufficient documentation

## 2015-02-05 DIAGNOSIS — L97811 Non-pressure chronic ulcer of other part of right lower leg limited to breakdown of skin: Secondary | ICD-10-CM | POA: Insufficient documentation

## 2015-02-12 DIAGNOSIS — I10 Essential (primary) hypertension: Secondary | ICD-10-CM | POA: Diagnosis not present

## 2015-02-12 DIAGNOSIS — Z6838 Body mass index (BMI) 38.0-38.9, adult: Secondary | ICD-10-CM | POA: Diagnosis not present

## 2015-02-12 DIAGNOSIS — E669 Obesity, unspecified: Secondary | ICD-10-CM | POA: Diagnosis not present

## 2015-02-12 DIAGNOSIS — E11622 Type 2 diabetes mellitus with other skin ulcer: Secondary | ICD-10-CM | POA: Diagnosis not present

## 2015-02-12 DIAGNOSIS — E1151 Type 2 diabetes mellitus with diabetic peripheral angiopathy without gangrene: Secondary | ICD-10-CM | POA: Diagnosis not present

## 2015-02-12 DIAGNOSIS — L97811 Non-pressure chronic ulcer of other part of right lower leg limited to breakdown of skin: Secondary | ICD-10-CM | POA: Diagnosis not present

## 2015-02-12 DIAGNOSIS — I83018 Varicose veins of right lower extremity with ulcer other part of lower leg: Secondary | ICD-10-CM | POA: Diagnosis not present

## 2015-02-12 DIAGNOSIS — J45909 Unspecified asthma, uncomplicated: Secondary | ICD-10-CM | POA: Diagnosis not present

## 2015-02-19 DIAGNOSIS — E1151 Type 2 diabetes mellitus with diabetic peripheral angiopathy without gangrene: Secondary | ICD-10-CM | POA: Diagnosis not present

## 2015-02-19 DIAGNOSIS — E669 Obesity, unspecified: Secondary | ICD-10-CM | POA: Diagnosis not present

## 2015-02-19 DIAGNOSIS — E11622 Type 2 diabetes mellitus with other skin ulcer: Secondary | ICD-10-CM | POA: Diagnosis not present

## 2015-02-19 DIAGNOSIS — L97811 Non-pressure chronic ulcer of other part of right lower leg limited to breakdown of skin: Secondary | ICD-10-CM | POA: Diagnosis not present

## 2015-02-19 DIAGNOSIS — J45909 Unspecified asthma, uncomplicated: Secondary | ICD-10-CM | POA: Diagnosis not present

## 2015-02-19 DIAGNOSIS — Z6838 Body mass index (BMI) 38.0-38.9, adult: Secondary | ICD-10-CM | POA: Diagnosis not present

## 2015-02-19 DIAGNOSIS — I83018 Varicose veins of right lower extremity with ulcer other part of lower leg: Secondary | ICD-10-CM | POA: Diagnosis not present

## 2015-02-19 DIAGNOSIS — I10 Essential (primary) hypertension: Secondary | ICD-10-CM | POA: Diagnosis not present

## 2015-02-26 DIAGNOSIS — Z6838 Body mass index (BMI) 38.0-38.9, adult: Secondary | ICD-10-CM | POA: Diagnosis not present

## 2015-02-26 DIAGNOSIS — E11622 Type 2 diabetes mellitus with other skin ulcer: Secondary | ICD-10-CM | POA: Diagnosis not present

## 2015-02-26 DIAGNOSIS — E669 Obesity, unspecified: Secondary | ICD-10-CM | POA: Diagnosis not present

## 2015-02-26 DIAGNOSIS — E1151 Type 2 diabetes mellitus with diabetic peripheral angiopathy without gangrene: Secondary | ICD-10-CM | POA: Diagnosis not present

## 2015-02-26 DIAGNOSIS — L97811 Non-pressure chronic ulcer of other part of right lower leg limited to breakdown of skin: Secondary | ICD-10-CM | POA: Diagnosis not present

## 2015-02-26 DIAGNOSIS — J45909 Unspecified asthma, uncomplicated: Secondary | ICD-10-CM | POA: Diagnosis not present

## 2015-02-26 DIAGNOSIS — I10 Essential (primary) hypertension: Secondary | ICD-10-CM | POA: Diagnosis not present

## 2015-02-26 DIAGNOSIS — I83018 Varicose veins of right lower extremity with ulcer other part of lower leg: Secondary | ICD-10-CM | POA: Diagnosis not present

## 2015-03-05 ENCOUNTER — Encounter (HOSPITAL_BASED_OUTPATIENT_CLINIC_OR_DEPARTMENT_OTHER): Payer: Commercial Managed Care - HMO | Attending: Internal Medicine

## 2015-03-05 DIAGNOSIS — I10 Essential (primary) hypertension: Secondary | ICD-10-CM | POA: Insufficient documentation

## 2015-03-05 DIAGNOSIS — L97211 Non-pressure chronic ulcer of right calf limited to breakdown of skin: Secondary | ICD-10-CM | POA: Insufficient documentation

## 2015-03-05 DIAGNOSIS — I739 Peripheral vascular disease, unspecified: Secondary | ICD-10-CM | POA: Insufficient documentation

## 2015-03-05 DIAGNOSIS — L03115 Cellulitis of right lower limb: Secondary | ICD-10-CM | POA: Insufficient documentation

## 2015-03-05 DIAGNOSIS — J45909 Unspecified asthma, uncomplicated: Secondary | ICD-10-CM | POA: Insufficient documentation

## 2015-03-05 DIAGNOSIS — E1151 Type 2 diabetes mellitus with diabetic peripheral angiopathy without gangrene: Secondary | ICD-10-CM | POA: Insufficient documentation

## 2015-03-06 DIAGNOSIS — I739 Peripheral vascular disease, unspecified: Secondary | ICD-10-CM | POA: Diagnosis not present

## 2015-03-06 DIAGNOSIS — E1151 Type 2 diabetes mellitus with diabetic peripheral angiopathy without gangrene: Secondary | ICD-10-CM | POA: Diagnosis not present

## 2015-03-06 DIAGNOSIS — J45909 Unspecified asthma, uncomplicated: Secondary | ICD-10-CM | POA: Diagnosis not present

## 2015-03-06 DIAGNOSIS — I10 Essential (primary) hypertension: Secondary | ICD-10-CM | POA: Diagnosis not present

## 2015-03-06 DIAGNOSIS — L97211 Non-pressure chronic ulcer of right calf limited to breakdown of skin: Secondary | ICD-10-CM | POA: Diagnosis not present

## 2015-03-06 DIAGNOSIS — L03115 Cellulitis of right lower limb: Secondary | ICD-10-CM | POA: Diagnosis not present

## 2015-03-07 DIAGNOSIS — K219 Gastro-esophageal reflux disease without esophagitis: Secondary | ICD-10-CM | POA: Diagnosis not present

## 2015-03-07 DIAGNOSIS — F329 Major depressive disorder, single episode, unspecified: Secondary | ICD-10-CM | POA: Diagnosis not present

## 2015-03-07 DIAGNOSIS — S39012A Strain of muscle, fascia and tendon of lower back, initial encounter: Secondary | ICD-10-CM | POA: Diagnosis not present

## 2015-03-07 DIAGNOSIS — F419 Anxiety disorder, unspecified: Secondary | ICD-10-CM | POA: Diagnosis not present

## 2015-03-07 DIAGNOSIS — E780 Pure hypercholesterolemia: Secondary | ICD-10-CM | POA: Diagnosis not present

## 2015-03-07 DIAGNOSIS — E119 Type 2 diabetes mellitus without complications: Secondary | ICD-10-CM | POA: Diagnosis not present

## 2015-03-07 DIAGNOSIS — M545 Low back pain: Secondary | ICD-10-CM | POA: Diagnosis not present

## 2015-03-07 DIAGNOSIS — Z79899 Other long term (current) drug therapy: Secondary | ICD-10-CM | POA: Diagnosis not present

## 2015-03-12 DIAGNOSIS — I739 Peripheral vascular disease, unspecified: Secondary | ICD-10-CM | POA: Diagnosis not present

## 2015-03-12 DIAGNOSIS — J45909 Unspecified asthma, uncomplicated: Secondary | ICD-10-CM | POA: Diagnosis not present

## 2015-03-12 DIAGNOSIS — F331 Major depressive disorder, recurrent, moderate: Secondary | ICD-10-CM | POA: Diagnosis not present

## 2015-03-12 DIAGNOSIS — L97211 Non-pressure chronic ulcer of right calf limited to breakdown of skin: Secondary | ICD-10-CM | POA: Diagnosis not present

## 2015-03-12 DIAGNOSIS — I10 Essential (primary) hypertension: Secondary | ICD-10-CM | POA: Diagnosis not present

## 2015-03-12 DIAGNOSIS — E1151 Type 2 diabetes mellitus with diabetic peripheral angiopathy without gangrene: Secondary | ICD-10-CM | POA: Diagnosis not present

## 2015-03-12 DIAGNOSIS — L03115 Cellulitis of right lower limb: Secondary | ICD-10-CM | POA: Diagnosis not present

## 2015-03-19 DIAGNOSIS — L97211 Non-pressure chronic ulcer of right calf limited to breakdown of skin: Secondary | ICD-10-CM | POA: Diagnosis not present

## 2015-03-19 DIAGNOSIS — L03115 Cellulitis of right lower limb: Secondary | ICD-10-CM | POA: Diagnosis not present

## 2015-03-19 DIAGNOSIS — I10 Essential (primary) hypertension: Secondary | ICD-10-CM | POA: Diagnosis not present

## 2015-03-19 DIAGNOSIS — I739 Peripheral vascular disease, unspecified: Secondary | ICD-10-CM | POA: Diagnosis not present

## 2015-03-19 DIAGNOSIS — J45909 Unspecified asthma, uncomplicated: Secondary | ICD-10-CM | POA: Diagnosis not present

## 2015-03-19 DIAGNOSIS — E1151 Type 2 diabetes mellitus with diabetic peripheral angiopathy without gangrene: Secondary | ICD-10-CM | POA: Diagnosis not present

## 2015-03-26 DIAGNOSIS — I739 Peripheral vascular disease, unspecified: Secondary | ICD-10-CM | POA: Diagnosis not present

## 2015-03-26 DIAGNOSIS — E1151 Type 2 diabetes mellitus with diabetic peripheral angiopathy without gangrene: Secondary | ICD-10-CM | POA: Diagnosis not present

## 2015-03-26 DIAGNOSIS — L03115 Cellulitis of right lower limb: Secondary | ICD-10-CM | POA: Diagnosis not present

## 2015-03-26 DIAGNOSIS — I10 Essential (primary) hypertension: Secondary | ICD-10-CM | POA: Diagnosis not present

## 2015-03-26 DIAGNOSIS — L97211 Non-pressure chronic ulcer of right calf limited to breakdown of skin: Secondary | ICD-10-CM | POA: Diagnosis not present

## 2015-03-26 DIAGNOSIS — J45909 Unspecified asthma, uncomplicated: Secondary | ICD-10-CM | POA: Diagnosis not present

## 2015-04-02 DIAGNOSIS — J45909 Unspecified asthma, uncomplicated: Secondary | ICD-10-CM | POA: Diagnosis not present

## 2015-04-02 DIAGNOSIS — L03115 Cellulitis of right lower limb: Secondary | ICD-10-CM | POA: Diagnosis not present

## 2015-04-02 DIAGNOSIS — I10 Essential (primary) hypertension: Secondary | ICD-10-CM | POA: Diagnosis not present

## 2015-04-02 DIAGNOSIS — E1151 Type 2 diabetes mellitus with diabetic peripheral angiopathy without gangrene: Secondary | ICD-10-CM | POA: Diagnosis not present

## 2015-04-02 DIAGNOSIS — L97211 Non-pressure chronic ulcer of right calf limited to breakdown of skin: Secondary | ICD-10-CM | POA: Diagnosis not present

## 2015-04-02 DIAGNOSIS — I739 Peripheral vascular disease, unspecified: Secondary | ICD-10-CM | POA: Diagnosis not present

## 2015-04-09 ENCOUNTER — Encounter (HOSPITAL_BASED_OUTPATIENT_CLINIC_OR_DEPARTMENT_OTHER): Payer: Commercial Managed Care - HMO | Attending: Internal Medicine

## 2015-04-09 DIAGNOSIS — I10 Essential (primary) hypertension: Secondary | ICD-10-CM | POA: Diagnosis not present

## 2015-04-09 DIAGNOSIS — E11622 Type 2 diabetes mellitus with other skin ulcer: Secondary | ICD-10-CM | POA: Insufficient documentation

## 2015-04-09 DIAGNOSIS — L97811 Non-pressure chronic ulcer of other part of right lower leg limited to breakdown of skin: Secondary | ICD-10-CM | POA: Diagnosis not present

## 2015-04-09 DIAGNOSIS — I872 Venous insufficiency (chronic) (peripheral): Secondary | ICD-10-CM | POA: Insufficient documentation

## 2015-04-09 DIAGNOSIS — J45909 Unspecified asthma, uncomplicated: Secondary | ICD-10-CM | POA: Diagnosis not present

## 2015-04-16 DIAGNOSIS — L97811 Non-pressure chronic ulcer of other part of right lower leg limited to breakdown of skin: Secondary | ICD-10-CM | POA: Diagnosis not present

## 2015-04-16 DIAGNOSIS — I10 Essential (primary) hypertension: Secondary | ICD-10-CM | POA: Diagnosis not present

## 2015-04-16 DIAGNOSIS — I872 Venous insufficiency (chronic) (peripheral): Secondary | ICD-10-CM | POA: Diagnosis not present

## 2015-04-16 DIAGNOSIS — J45909 Unspecified asthma, uncomplicated: Secondary | ICD-10-CM | POA: Diagnosis not present

## 2015-04-16 DIAGNOSIS — E11622 Type 2 diabetes mellitus with other skin ulcer: Secondary | ICD-10-CM | POA: Diagnosis not present

## 2015-04-23 DIAGNOSIS — L97811 Non-pressure chronic ulcer of other part of right lower leg limited to breakdown of skin: Secondary | ICD-10-CM | POA: Diagnosis not present

## 2015-04-23 DIAGNOSIS — J45909 Unspecified asthma, uncomplicated: Secondary | ICD-10-CM | POA: Diagnosis not present

## 2015-04-23 DIAGNOSIS — E11622 Type 2 diabetes mellitus with other skin ulcer: Secondary | ICD-10-CM | POA: Diagnosis not present

## 2015-04-23 DIAGNOSIS — I872 Venous insufficiency (chronic) (peripheral): Secondary | ICD-10-CM | POA: Diagnosis not present

## 2015-04-23 DIAGNOSIS — I10 Essential (primary) hypertension: Secondary | ICD-10-CM | POA: Diagnosis not present

## 2015-04-30 DIAGNOSIS — L97811 Non-pressure chronic ulcer of other part of right lower leg limited to breakdown of skin: Secondary | ICD-10-CM | POA: Diagnosis not present

## 2015-04-30 DIAGNOSIS — I10 Essential (primary) hypertension: Secondary | ICD-10-CM | POA: Diagnosis not present

## 2015-04-30 DIAGNOSIS — I872 Venous insufficiency (chronic) (peripheral): Secondary | ICD-10-CM | POA: Diagnosis not present

## 2015-04-30 DIAGNOSIS — J45909 Unspecified asthma, uncomplicated: Secondary | ICD-10-CM | POA: Diagnosis not present

## 2015-04-30 DIAGNOSIS — E11622 Type 2 diabetes mellitus with other skin ulcer: Secondary | ICD-10-CM | POA: Diagnosis not present

## 2015-05-07 ENCOUNTER — Encounter (HOSPITAL_BASED_OUTPATIENT_CLINIC_OR_DEPARTMENT_OTHER): Payer: Commercial Managed Care - HMO | Attending: Internal Medicine

## 2015-05-07 DIAGNOSIS — L84 Corns and callosities: Secondary | ICD-10-CM | POA: Diagnosis not present

## 2015-05-07 DIAGNOSIS — E119 Type 2 diabetes mellitus without complications: Secondary | ICD-10-CM | POA: Diagnosis not present

## 2015-05-07 DIAGNOSIS — I872 Venous insufficiency (chronic) (peripheral): Secondary | ICD-10-CM | POA: Diagnosis not present

## 2015-05-07 DIAGNOSIS — I10 Essential (primary) hypertension: Secondary | ICD-10-CM | POA: Insufficient documentation

## 2015-05-07 DIAGNOSIS — L97811 Non-pressure chronic ulcer of other part of right lower leg limited to breakdown of skin: Secondary | ICD-10-CM | POA: Diagnosis present

## 2015-05-07 DIAGNOSIS — F331 Major depressive disorder, recurrent, moderate: Secondary | ICD-10-CM | POA: Diagnosis not present

## 2015-05-07 DIAGNOSIS — J45909 Unspecified asthma, uncomplicated: Secondary | ICD-10-CM | POA: Insufficient documentation

## 2015-05-07 DIAGNOSIS — Z09 Encounter for follow-up examination after completed treatment for conditions other than malignant neoplasm: Secondary | ICD-10-CM | POA: Diagnosis not present

## 2015-05-07 DIAGNOSIS — Z872 Personal history of diseases of the skin and subcutaneous tissue: Secondary | ICD-10-CM | POA: Diagnosis not present

## 2015-07-20 ENCOUNTER — Encounter (HOSPITAL_COMMUNITY): Payer: Self-pay

## 2015-07-20 ENCOUNTER — Emergency Department (HOSPITAL_COMMUNITY)
Admission: EM | Admit: 2015-07-20 | Discharge: 2015-07-21 | Disposition: A | Discharge status: Home / Self Care | Payer: Commercial Managed Care - HMO | Attending: Emergency Medicine | Admitting: Emergency Medicine

## 2015-07-20 ENCOUNTER — Emergency Department (HOSPITAL_COMMUNITY): Payer: Commercial Managed Care - HMO

## 2015-07-20 DIAGNOSIS — M79604 Pain in right leg: Secondary | ICD-10-CM | POA: Diagnosis not present

## 2015-07-20 DIAGNOSIS — R0789 Other chest pain: Secondary | ICD-10-CM | POA: Diagnosis not present

## 2015-07-20 DIAGNOSIS — I82401 Acute embolism and thrombosis of unspecified deep veins of right lower extremity: Secondary | ICD-10-CM

## 2015-07-20 DIAGNOSIS — I10 Essential (primary) hypertension: Secondary | ICD-10-CM | POA: Insufficient documentation

## 2015-07-20 DIAGNOSIS — R0602 Shortness of breath: Secondary | ICD-10-CM | POA: Insufficient documentation

## 2015-07-20 DIAGNOSIS — Z7984 Long term (current) use of oral hypoglycemic drugs: Secondary | ICD-10-CM | POA: Insufficient documentation

## 2015-07-20 DIAGNOSIS — M7989 Other specified soft tissue disorders: Secondary | ICD-10-CM | POA: Diagnosis not present

## 2015-07-20 DIAGNOSIS — R1310 Dysphagia, unspecified: Secondary | ICD-10-CM | POA: Diagnosis not present

## 2015-07-20 DIAGNOSIS — E119 Type 2 diabetes mellitus without complications: Secondary | ICD-10-CM | POA: Diagnosis not present

## 2015-07-20 DIAGNOSIS — M79605 Pain in left leg: Secondary | ICD-10-CM | POA: Diagnosis not present

## 2015-07-20 DIAGNOSIS — Z3202 Encounter for pregnancy test, result negative: Secondary | ICD-10-CM | POA: Diagnosis not present

## 2015-07-20 DIAGNOSIS — L98499 Non-pressure chronic ulcer of skin of other sites with unspecified severity: Secondary | ICD-10-CM | POA: Diagnosis not present

## 2015-07-20 DIAGNOSIS — R079 Chest pain, unspecified: Secondary | ICD-10-CM | POA: Insufficient documentation

## 2015-07-20 DIAGNOSIS — F339 Major depressive disorder, recurrent, unspecified: Secondary | ICD-10-CM | POA: Diagnosis not present

## 2015-07-20 DIAGNOSIS — K219 Gastro-esophageal reflux disease without esophagitis: Secondary | ICD-10-CM | POA: Insufficient documentation

## 2015-07-20 DIAGNOSIS — M79661 Pain in right lower leg: Secondary | ICD-10-CM | POA: Diagnosis not present

## 2015-07-20 DIAGNOSIS — Z79899 Other long term (current) drug therapy: Secondary | ICD-10-CM | POA: Diagnosis not present

## 2015-07-20 DIAGNOSIS — E1165 Type 2 diabetes mellitus with hyperglycemia: Secondary | ICD-10-CM | POA: Diagnosis not present

## 2015-07-20 DIAGNOSIS — E785 Hyperlipidemia, unspecified: Secondary | ICD-10-CM | POA: Diagnosis not present

## 2015-07-20 LAB — CBC WITH DIFFERENTIAL/PLATELET
Basophils Absolute: 0.1 10*3/uL (ref 0.0–0.1)
Basophils Relative: 1 %
EOS ABS: 0.2 10*3/uL (ref 0.0–0.7)
Eosinophils Relative: 3 %
HCT: 37 % (ref 36.0–46.0)
HEMOGLOBIN: 11.9 g/dL — AB (ref 12.0–15.0)
LYMPHS ABS: 3.3 10*3/uL (ref 0.7–4.0)
LYMPHS PCT: 37 %
MCH: 26.8 pg (ref 26.0–34.0)
MCHC: 32.2 g/dL (ref 30.0–36.0)
MCV: 83.3 fL (ref 78.0–100.0)
Monocytes Absolute: 0.3 10*3/uL (ref 0.1–1.0)
Monocytes Relative: 4 %
NEUTROS PCT: 55 %
Neutro Abs: 5.1 10*3/uL (ref 1.7–7.7)
Platelets: 265 10*3/uL (ref 150–400)
RBC: 4.44 MIL/uL (ref 3.87–5.11)
RDW: 14.5 % (ref 11.5–15.5)
WBC: 9 10*3/uL (ref 4.0–10.5)

## 2015-07-20 LAB — PREGNANCY, URINE: PREG TEST UR: NEGATIVE

## 2015-07-20 LAB — I-STAT BETA HCG BLOOD, ED (MC, WL, AP ONLY)

## 2015-07-20 LAB — BASIC METABOLIC PANEL
Anion gap: 12 (ref 5–15)
BUN: 8 mg/dL (ref 6–20)
CHLORIDE: 105 mmol/L (ref 101–111)
CO2: 21 mmol/L — ABNORMAL LOW (ref 22–32)
Calcium: 9.1 mg/dL (ref 8.9–10.3)
Creatinine, Ser: 0.57 mg/dL (ref 0.44–1.00)
GFR calc Af Amer: >60 mL/min (ref >60)
GFR calc non Af Amer: >60 mL/min (ref >60)
Glucose, Bld: 142 mg/dL — ABNORMAL HIGH (ref 65–99)
POTASSIUM: 3.9 mmol/L (ref 3.5–5.1)
SODIUM: 138 mmol/L (ref 135–145)

## 2015-07-20 LAB — I-STAT TROPONIN, ED: TROPONIN I, POC: 0.01 ng/mL (ref 0.00–0.08)

## 2015-07-20 MED ORDER — IOHEXOL 350 MG/ML SOLN
100.0000 mL | Freq: Once | INTRAVENOUS | Status: AC | PRN
  Administered 2015-07-20: 100 mL via INTRAVENOUS

## 2015-07-20 NOTE — ED Notes (Signed)
Pt arrives EMS from MD office with c/o SWelling and pain at right lower leg since Sunday. Pt c/o chest pain earlier but resolved. Now. Pt c/o intermittent sharp pain at right and left rib border but none now.

## 2015-07-20 NOTE — ED Provider Notes (Signed)
CSN: [id number]     Arrival date & time 07/20/15  1907 History   First MD Initiated Contact with Patient 07/20/15 1913     Chief Complaint  Patient presents with  . Leg Swelling   (Consider location/radiation/quality/duration/timing/severity/associated sxs/prior Treatment) The history is provided by the patient, the spouse and a parent. No language interpreter was used.   Taylor Patterson is a 40 year old female with history of right venous stasis ulcer, diabetes, hypertension, and GERD who presents after being seen by her PCP for right lower extremity swelling and pain 2 days. She also reports chest pain and shortness of breath that has been intermittent and sharp for the past 3 months. No radiation of pain. She denies any smoking history. No history of DVT.  She denies any fever, chills, cough, hemoptysis, recent surgery, abdominal pain, nausea, vomiting.   Past Medical History  Diagnosis Date  . Venous stasis ulcer (HCC)   . Diabetes mellitus without complication (HCC)   . Hypertension   . GERD (gastroesophageal reflux disease)    Past Surgical History  Procedure Laterality Date  . Cholecystectomy     Family History  Problem Relation Age of Onset  . Diabetes Other   . Hyperlipidemia Other   . Hypertension Other   . Asthma Other   . Stroke Other    Social History  Substance Use Topics  . Smoking status: Never Smoker   . Smokeless tobacco: None  . Alcohol Use: No   OB History    No data available     Review of Systems  Constitutional: Negative for fever and chills.  Respiratory: Positive for shortness of breath. Negative for cough.   Cardiovascular: Positive for chest pain and leg swelling.  Gastrointestinal: Negative for vomiting.  All other systems reviewed and are negative.     Allergies  Kiwi extract; Pineapple; Eggs or egg-derived products; Other; and Sulfa antibiotics  Home Medications   Prior to Admission medications   Medication Sig Start Date End Date  Taking? Authorizing Provider  atorvastatin (LIPITOR) 20 MG tablet Take 20 mg by mouth daily at 6 PM.   Yes Historical Provider, MD  buPROPion (WELLBUTRIN XL) 300 MG 24 hr tablet Take 300 mg by mouth daily.   Yes Historical Provider, MD  glimepiride (AMARYL) 2 MG tablet Take 2 mg by mouth daily with breakfast.   Yes Historical Provider, MD  HYDROcodone-acetaminophen (NORCO/VICODIN) 5-325 MG tablet Take 1 tablet by mouth every 6 (six) hours as needed for moderate pain.   Yes Historical Provider, MD  metFORMIN (GLUCOPHAGE) 500 MG tablet Take 1,000 mg by mouth 2 (two) times daily with a meal.    Yes Historical Provider, MD  methocarbamol (ROBAXIN) 500 MG tablet Take 500 mg by mouth 4 (four) times daily.   Yes Historical Provider, MD  pantoprazole (PROTONIX) 40 MG tablet Take 40 mg by mouth daily.   Yes Historical Provider, MD  sertraline (ZOLOFT) 50 MG tablet Take 100 mg by mouth daily.   Yes Historical Provider, MD  methylcellulose oral powder Take by mouth daily.    Historical Provider, MD  nystatin cream (MYCOSTATIN) Apply to affected area 2 times daily 08/11/14   Victorino DikeJennifer Piepenbrink, PA-C   BP 129/71 mmHg  Pulse 73  Temp(Src) 99 F (37.2 C) (Oral)  Resp 16  Ht 5\' 5"  (1.651 m)  Wt 117.935 kg  BMI 43.27 kg/m2  SpO2 98%  LMP 07/11/2015 Physical Exam  Constitutional: She is oriented to person, place, and time. She appears  well-developed and well-nourished. No distress.  HENT:  Head: Normocephalic and atraumatic.  Eyes: Conjunctivae are normal.  Neck: Normal range of motion. Neck supple.  Cardiovascular: Normal rate, regular rhythm and normal heart sounds.   No murmur heard. Regular rate and rhythm. No murmur.  Pulmonary/Chest: Effort normal and breath sounds normal. No respiratory distress. She has no wheezes. She has no rales. She exhibits no tenderness.  Lungs clear to auscultation. No decreased breath sounds. No respiratory distress. 98% oxygen on room air.  Abdominal: Soft. There is  no tenderness.  Abdomen is soft and nontender.  Musculoskeletal: Normal range of motion.  Right leg: Lower extremity Swelling compared to left leg. Tenderness to palpation. No significant erythema or drainage. No streaking. 2+ DP pulse. Healed ulceration. Left leg: Normal.  Neurological: She is alert and oriented to person, place, and time.  Skin: Skin is warm and dry.  Nursing note and vitals reviewed.   ED Course  Procedures (including critical care time) Labs Review Labs Reviewed  CBC WITH DIFFERENTIAL/PLATELET - Abnormal; Notable for the following:    Hemoglobin 11.9 (*)    All other components within normal limits  BASIC METABOLIC PANEL - Abnormal; Notable for the following:    CO2 21 (*)    Glucose, Bld 142 (*)    All other components within normal limits  PREGNANCY, URINE  I-STAT TROPOININ, ED  I-STAT BETA HCG BLOOD, ED ([facility], [facility], [facility] ONLY)    Imaging Review Dg Chest 2 View  07/20/2015  CLINICAL DATA:  Pain and swelling right lower leg for 3 days, right chest pain EXAM: CHEST  2 VIEW COMPARISON:  None. FINDINGS: The heart size and mediastinal contours are within normal limits. Both lungs are clear. The visualized skeletal structures are unremarkable. IMPRESSION: No active cardiopulmonary disease. Electronically Signed   By: Esperanza Heir M.D.   On: 07/20/2015 19:49   Ct Angio Chest Pe W/cm &/or Wo Cm  07/20/2015  CLINICAL DATA:  40 year old female complaining of pain and swelling in the right lower leg for the past 24 hours, with some intermittent chest pain and sharp pain in the right shoulder and left rib border. EXAM: CT ANGIOGRAPHY CHEST WITH CONTRAST TECHNIQUE: Multidetector CT imaging of the chest was performed using the standard protocol during bolus administration of intravenous contrast. Multiplanar CT image reconstructions and MIPs were obtained to evaluate the vascular anatomy. CONTRAST:  OMNIPAQUE IOHEXOL 350 MG/ML SOLN COMPARISON:  No priors. FINDINGS:  Mediastinum/Lymph Nodes: There are no filling defects within the pulmonary arterial tree to suggest underlying pulmonary embolism. Heart size is normal. There is no significant pericardial fluid, thickening or pericardial calcification. No pathologically enlarged mediastinal or hilar lymph nodes. Esophagus is unremarkable in appearance. No axillary lymphadenopathy. Lungs/Pleura: No acute consolidative airspace disease. No pleural effusions. No suspicious appearing pulmonary nodules or masses. Upper Abdomen: Status post cholecystectomy. Musculoskeletal/Soft Tissues: There are no aggressive appearing lytic or blastic lesions noted in the visualized portions of the skeleton. Review of the MIP images confirms the above findings. IMPRESSION: 1. No evidence of pulmonary embolism. 2. No acute findings in the thorax to account for the patient's symptoms. Electronically Signed   By: Trudie Reed M.D.   On: 07/20/2015 23:59   I have personally reviewed and evaluated these images and lab results as part of my medical decision-making.   EKG Interpretation None      MDM   Final diagnoses:  Suspected DVT (deep vein thrombosis), right [facility])   Patient presents for  DVT rule out with intermittent chest pain and shortness of breath 3 months. She has a negative troponin. Heart score is very low for any ACS. Doppler not available at [facility] at 7 PM. CTA of the chest is negative for PE. I do not believe this is cardiac in nature. Atypical pain. Labs unremarkable. Do not suspect cellulitis of the right leg. I discussed results with patient. I also discussed that we would be giving her Lovenox injection prophylactically for DVT. She would need to come for a Doppler in the morning. Patient agrees with plan and follow-up. All questions and concerns were answered. Medications  iohexol (OMNIPAQUE) 350 MG/ML injection 100 mL (100 mLs Intravenous Contrast Given 07/20/15 2317)  enoxaparin (LOVENOX) injection 120 mg (120 mg  Subcutaneous Given 07/21/15 0117)   Filed Vitals:   07/21/15 0100 07/21/15 0115  BP: 125/77 129/71  Pulse: 87 73  Temp:    Resp:         Catha Gosselin, PA-C 07/21/15 0158  Leta Baptist, MD 07/24/15 1712

## 2015-07-20 NOTE — ED Notes (Signed)
Patient transported to CT 

## 2015-07-21 ENCOUNTER — Ambulatory Visit (HOSPITAL_BASED_OUTPATIENT_CLINIC_OR_DEPARTMENT_OTHER)
Admission: RE | Admit: 2015-07-21 | Discharge: 2015-07-21 | Disposition: A | Discharge status: Home / Self Care | Payer: Commercial Managed Care - HMO | Source: Ambulatory Visit | Attending: Physician Assistant | Admitting: Physician Assistant

## 2015-07-21 DIAGNOSIS — Z7984 Long term (current) use of oral hypoglycemic drugs: Secondary | ICD-10-CM | POA: Diagnosis not present

## 2015-07-21 DIAGNOSIS — R1310 Dysphagia, unspecified: Secondary | ICD-10-CM | POA: Diagnosis not present

## 2015-07-21 DIAGNOSIS — M7989 Other specified soft tissue disorders: Secondary | ICD-10-CM | POA: Diagnosis not present

## 2015-07-21 DIAGNOSIS — M79609 Pain in unspecified limb: Secondary | ICD-10-CM

## 2015-07-21 DIAGNOSIS — I809 Phlebitis and thrombophlebitis of unspecified site: Secondary | ICD-10-CM | POA: Diagnosis not present

## 2015-07-21 DIAGNOSIS — R609 Edema, unspecified: Secondary | ICD-10-CM | POA: Diagnosis not present

## 2015-07-21 DIAGNOSIS — E785 Hyperlipidemia, unspecified: Secondary | ICD-10-CM | POA: Diagnosis not present

## 2015-07-21 DIAGNOSIS — E669 Obesity, unspecified: Secondary | ICD-10-CM | POA: Diagnosis not present

## 2015-07-21 DIAGNOSIS — E1165 Type 2 diabetes mellitus with hyperglycemia: Secondary | ICD-10-CM | POA: Diagnosis not present

## 2015-07-21 DIAGNOSIS — F419 Anxiety disorder, unspecified: Secondary | ICD-10-CM | POA: Diagnosis not present

## 2015-07-21 DIAGNOSIS — F339 Major depressive disorder, recurrent, unspecified: Secondary | ICD-10-CM | POA: Diagnosis not present

## 2015-07-21 MED ORDER — ENOXAPARIN SODIUM 120 MG/0.8ML ~~LOC~~ SOLN
1.0000 mg/kg | Freq: Once | SUBCUTANEOUS | Status: AC
  Administered 2015-07-21: 120 mg via SUBCUTANEOUS
  Filled 2015-07-21: qty 0.8

## 2015-07-21 MED ORDER — ENOXAPARIN SODIUM 150 MG/ML ~~LOC~~ SOLN: 1.5000 mg/kg | Freq: Once | SUBCUTANEOUS | Status: DC

## 2015-07-21 NOTE — Progress Notes (Signed)
Preliminary results by tech - Right Lower Ext. Venous Duplex Completed. Negative for deep vein thrombosis in the right leg, and positive for a superficial vein thrombus in her proximal and distal medial calf in area of pain.  Results given to [place], ED charge nurse. Marilynne Halsted, BS, RDMS, RVT

## 2015-07-21 NOTE — Discharge Instructions (Signed)
Deep Vein Thrombosis °A deep vein thrombosis (DVT) is a blood clot (thrombus) that usually occurs in a deep, larger vein of the lower leg or the pelvis, or in an upper extremity such as the arm. These are dangerous and can lead to serious and even life-threatening complications if the clot travels to the lungs. °A DVT can damage the valves in your leg veins so that instead of flowing upward, the blood pools in the lower leg. This is called post-thrombotic syndrome, and it can result in pain, swelling, discoloration, and sores on the leg. °CAUSES °A DVT is caused by the formation of a blood clot in your leg, pelvis, or arm. Usually, several things contribute to the formation of blood clots. A clot may develop when: °· Your blood flow slows down. °· Your vein becomes damaged in some way. °· You have a condition that makes your blood clot more easily. °RISK FACTORS °A DVT is more likely to develop in: °· People who are older, especially over 60 years of age. °· People who are overweight (obese). °· People who sit or lie still for a long time, such as during long-distance travel (over 4 hours), bed rest, hospitalization, or during recovery from certain medical conditions like a stroke. °· People who do not engage in much physical activity (sedentary lifestyle). °· People who have chronic breathing disorders. °· People who have a personal or family history of blood clots or blood clotting disease. °· People who have peripheral vascular disease (PVD), diabetes, or some types of cancer. °· People who have heart disease, especially if the person had a recent heart attack or has congestive heart failure. °· People who have neurological diseases that affect the legs (leg paresis). °· People who have had a traumatic injury, such as breaking a hip or leg. °· People who have recently had major or lengthy surgery, especially on the hip, knee, or abdomen. °· People who have had a central line placed inside a large vein. °· People  who take medicines that contain the hormone estrogen. These include birth control pills and hormone replacement therapy. °· Pregnancy or during childbirth or the postpartum period. °· Long plane flights (over 8 hours). °SIGNS AND SYMPTOMS °Symptoms of a DVT can include:  °· Swelling of your leg or arm, especially if one side is much worse. °· Warmth and redness of your leg or arm, especially if one side is much worse. °· Pain in your arm or leg. If the clot is in your leg, symptoms may be more noticeable or worse when you stand or walk. °· A feeling of pins and needles, if the clot is in the arm. °The symptoms of a DVT that has traveled to the lungs (pulmonary embolism, PE) usually start suddenly and include: °· Shortness of breath while active or at rest. °· Coughing or coughing up blood or blood-tinged mucus. °· Chest pain that is often worse with deep breaths. °· Rapid or irregular heartbeat. °· Feeling light-headed or dizzy. °· Fainting. °· Feeling anxious. °· Sweating. °There may also be pain and swelling in a leg if that is where the blood clot started. °These symptoms may represent a serious problem that is an emergency. Do not wait to see if the symptoms will go away. Get medical help right away. Call your local emergency services (911 in the [place] Do not drive yourself to the hospital. °DIAGNOSIS °Your health care provider will take a medical history and perform a physical exam. You may also   have other tests, including: °· Blood tests to assess the clotting properties of your blood. °· Imaging tests, such as CT, ultrasound, MRI, X-ray, and other tests to see if you have clots anywhere in your body. °TREATMENT °After a DVT is identified, it can be treated. The type of treatment that you receive depends on many factors, such as the cause of your DVT, your risk for bleeding or developing more clots, and other medical conditions that you have. Sometimes, a combination of treatments is necessary. Treatment  options may be combined and include: °· Monitoring the blood clot with ultrasound. °· Taking medicines by mouth, such as newer blood thinners (anticoagulants), thrombolytics, or warfarin. °· Taking anticoagulant medicine by injection or through an IV tube. °· Wearing compression stockings or using different types of devices. °· Surgery (rare) to remove the blood clot or to place a filter in your abdomen to stop the blood clot from traveling to your lungs. °Treatments for a DVT are often divided into immediate treatment and long-term treatment (up to 3 months after DVT). You can work with your health care provider to choose the treatment program that is best for you. °HOME CARE INSTRUCTIONS °If you are taking a newer oral anticoagulant: °· Take the medicine every single day at the same time each day. °· Understand what foods and drugs interact with this medicine. °· Understand that there are no regular blood tests required when using this medicine. °· Understand the side effects of this medicine, including excessive bruising or bleeding. Ask your health care provider or pharmacist about other possible side effects. °If you are taking warfarin: °· Understand how to take warfarin and know which foods can affect how warfarin works in your body. °· Understand that it is dangerous to take too much or too little warfarin. Too much warfarin increases the risk of bleeding. Too little warfarin continues to allow the risk for blood clots. °· Follow your PT and INR blood testing schedule. The PT and INR results allow your health care provider to adjust your dose of warfarin. It is very important that you have your PT and INR tested as often as told by your health care provider. °· Avoid major changes in your diet, or tell your health care provider before you change your diet. Arrange a visit with a registered dietitian to answer your questions. Many foods, especially foods that are high in vitamin K, can interfere with warfarin  and affect the PT and INR results. Eat a consistent amount of foods that are high in vitamin K, such as: °¨ Spinach, kale, broccoli, cabbage, collard greens, turnip greens, Brussels sprouts, peas, cauliflower, seaweed, and parsley. °¨ Beef liver and pork liver. °¨ Green tea. °¨ Soybean oil. °· Tell your health care provider about any and all medicines, vitamins, and supplements that you take, including aspirin and other over-the-counter anti-inflammatory medicines. Be especially cautious with aspirin and anti-inflammatory medicines. Do not take those before you ask your health care provider if it is safe to do so. This is important because many medicines can interfere with warfarin and affect the PT and INR results. °· Do not start or stop taking any over-the-counter or prescription medicine unless your health care provider or pharmacist tells you to do so. °If you take warfarin, you will also need to do these things: °· Hold pressure over cuts for longer than usual. °· Tell your dentist and other health care providers that you are taking warfarin before you have any procedures in which   bleeding may occur. °· Avoid alcohol or drink very small amounts. Tell your health care provider if you change your alcohol intake. °· Do not use tobacco products, including cigarettes, chewing tobacco, and e-cigarettes. If you need help quitting, ask your health care provider. °· Avoid contact sports. °General Instructions °· Take over-the-counter and prescription medicines only as told by your health care provider. Anticoagulant medicines can have side effects, including easy bruising and difficulty stopping bleeding. If you are prescribed an anticoagulant, you will also need to do these things: °¨ Hold pressure over cuts for longer than usual. °¨ Tell your dentist and other health care providers that you are taking anticoagulants before you have any procedures in which bleeding may occur. °¨ Avoid contact sports. °· Wear a medical  alert bracelet or carry a medical alert card that says you have had a PE. °· Ask your health care provider how soon you can go back to your normal activities. Stay active to prevent new blood clots from forming. °· Make sure to exercise while traveling or when you have been sitting or standing for a long period of time. It is very important to exercise. Exercise your legs by walking or by tightening and relaxing your leg muscles often. Take frequent walks. °· Wear compression stockings as told by your health care provider to help prevent more blood clots from forming. °· Do not use tobacco products, including cigarettes, chewing tobacco, and e-cigarettes. If you need help quitting, ask your health care provider. °· Keep all follow-up appointments with your health care provider. This is important. °PREVENTION °Take these actions to decrease your risk of developing another DVT: °· Exercise regularly. For at least 30 minutes every day, engage in: °¨ Activity that involves moving your arms and legs. °¨ Activity that encourages good blood flow through your body by increasing your heart rate. °· Exercise your arms and legs every hour during long-distance travel (over 4 hours). Drink plenty of water and avoid drinking alcohol while traveling. °· Avoid sitting or lying in bed for long periods of time without moving your legs. °· Maintain a weight that is appropriate for your height. Ask your health care provider what weight is healthy for you. °· If you are a woman who is over 35 years of age, avoid unnecessary use of medicines that contain estrogen. These include birth control pills. °· Do not smoke, especially if you take estrogen medicines. If you need help quitting, ask your health care provider. °If you are hospitalized, prevention measures may include: °· Early walking after surgery, as soon as your health care provider says that it is safe. °· Receiving anticoagulants to prevent blood clots. If you cannot take  anticoagulants, other options may be available, such as wearing compression stockings or using different types of devices. °SEEK IMMEDIATE MEDICAL CARE IF: °· You have new or increased pain, swelling, or redness in an arm or leg. °· You have numbness or tingling in an arm or leg. °· You have shortness of breath while active or at rest. °· You have chest pain. °· You have a rapid or irregular heartbeat. °· You feel light-headed or dizzy. °· You cough up blood. °· You notice blood in your vomit, bowel movement, or urine. °These symptoms may represent a serious problem that is an emergency. Do not wait to see if the symptoms will go away. Get medical help right away. Call your local emergency services (911 in the [place] Do not drive yourself to the hospital. °  °  This information is not intended to replace advice given to you by your health care provider. Make sure you discuss any questions you have with your health care provider. °  °Document Released: 06/20/2005 Document Revised: 03/11/2015 Document Reviewed: 10/15/2014 °[employer] Interactive Patient Education ©2016 [employer] ° °

## 2015-08-04 ENCOUNTER — Other Ambulatory Visit: Payer: Self-pay | Admitting: Gastroenterology

## 2015-08-04 DIAGNOSIS — R1310 Dysphagia, unspecified: Secondary | ICD-10-CM | POA: Diagnosis not present

## 2015-08-04 DIAGNOSIS — R4702 Dysphasia: Secondary | ICD-10-CM

## 2015-08-06 ENCOUNTER — Ambulatory Visit
Admission: RE | Admit: 2015-08-06 | Discharge: 2015-08-06 | Disposition: A | Discharge status: Home / Self Care | Payer: Commercial Managed Care - HMO | Source: Ambulatory Visit | Attending: Gastroenterology | Admitting: Gastroenterology

## 2015-08-06 DIAGNOSIS — R1310 Dysphagia, unspecified: Secondary | ICD-10-CM | POA: Diagnosis not present

## 2015-08-06 DIAGNOSIS — K449 Diaphragmatic hernia without obstruction or gangrene: Secondary | ICD-10-CM | POA: Diagnosis not present

## 2015-08-06 DIAGNOSIS — R4702 Dysphasia: Secondary | ICD-10-CM

## 2015-08-18 DIAGNOSIS — E119 Type 2 diabetes mellitus without complications: Secondary | ICD-10-CM | POA: Diagnosis not present

## 2015-09-04 DIAGNOSIS — F331 Major depressive disorder, recurrent, moderate: Secondary | ICD-10-CM | POA: Diagnosis not present

## 2015-09-09 DIAGNOSIS — E1165 Type 2 diabetes mellitus with hyperglycemia: Secondary | ICD-10-CM | POA: Diagnosis not present

## 2015-09-09 DIAGNOSIS — R110 Nausea: Secondary | ICD-10-CM | POA: Diagnosis not present

## 2015-09-09 DIAGNOSIS — N912 Amenorrhea, unspecified: Secondary | ICD-10-CM | POA: Diagnosis not present

## 2015-09-09 DIAGNOSIS — J309 Allergic rhinitis, unspecified: Secondary | ICD-10-CM | POA: Diagnosis not present

## 2015-09-09 DIAGNOSIS — Z7984 Long term (current) use of oral hypoglycemic drugs: Secondary | ICD-10-CM | POA: Diagnosis not present

## 2015-09-09 DIAGNOSIS — Z3202 Encounter for pregnancy test, result negative: Secondary | ICD-10-CM | POA: Diagnosis not present

## 2015-09-16 DIAGNOSIS — Z3202 Encounter for pregnancy test, result negative: Secondary | ICD-10-CM | POA: Diagnosis not present

## 2015-09-16 DIAGNOSIS — J019 Acute sinusitis, unspecified: Secondary | ICD-10-CM | POA: Diagnosis not present

## 2015-09-16 DIAGNOSIS — N912 Amenorrhea, unspecified: Secondary | ICD-10-CM | POA: Diagnosis not present

## 2015-09-29 ENCOUNTER — Other Ambulatory Visit (HOSPITAL_COMMUNITY)
Admission: RE | Admit: 2015-09-29 | Discharge: 2015-09-29 | Disposition: A | Discharge status: Home / Self Care | Payer: Commercial Managed Care - HMO | Source: Ambulatory Visit | Attending: Obstetrics & Gynecology | Admitting: Obstetrics & Gynecology

## 2015-09-29 ENCOUNTER — Other Ambulatory Visit: Payer: Self-pay | Admitting: Obstetrics & Gynecology

## 2015-09-29 DIAGNOSIS — N911 Secondary amenorrhea: Secondary | ICD-10-CM | POA: Diagnosis not present

## 2015-09-29 DIAGNOSIS — Z1151 Encounter for screening for human papillomavirus (HPV): Secondary | ICD-10-CM | POA: Insufficient documentation

## 2015-09-29 DIAGNOSIS — Z01419 Encounter for gynecological examination (general) (routine) without abnormal findings: Secondary | ICD-10-CM | POA: Insufficient documentation

## 2015-09-29 DIAGNOSIS — Z6841 Body Mass Index (BMI) 40.0 and over, adult: Secondary | ICD-10-CM | POA: Diagnosis not present

## 2015-09-29 DIAGNOSIS — Z01411 Encounter for gynecological examination (general) (routine) with abnormal findings: Secondary | ICD-10-CM | POA: Diagnosis not present

## 2015-09-29 DIAGNOSIS — E1165 Type 2 diabetes mellitus with hyperglycemia: Secondary | ICD-10-CM | POA: Diagnosis not present

## 2015-09-29 DIAGNOSIS — Z7984 Long term (current) use of oral hypoglycemic drugs: Secondary | ICD-10-CM | POA: Diagnosis not present

## 2015-09-29 DIAGNOSIS — Z3169 Encounter for other general counseling and advice on procreation: Secondary | ICD-10-CM | POA: Diagnosis not present

## 2015-09-30 LAB — CYTOLOGY - PAP

## 2015-10-06 DIAGNOSIS — Z7984 Long term (current) use of oral hypoglycemic drugs: Secondary | ICD-10-CM | POA: Diagnosis not present

## 2015-10-06 DIAGNOSIS — E1165 Type 2 diabetes mellitus with hyperglycemia: Secondary | ICD-10-CM | POA: Diagnosis not present

## 2015-10-06 DIAGNOSIS — Z3169 Encounter for other general counseling and advice on procreation: Secondary | ICD-10-CM | POA: Diagnosis not present

## 2015-10-06 DIAGNOSIS — N911 Secondary amenorrhea: Secondary | ICD-10-CM | POA: Diagnosis not present

## 2015-10-06 DIAGNOSIS — R55 Syncope and collapse: Secondary | ICD-10-CM | POA: Diagnosis not present

## 2015-10-06 DIAGNOSIS — Z6841 Body Mass Index (BMI) 40.0 and over, adult: Secondary | ICD-10-CM | POA: Diagnosis not present

## 2015-10-15 DIAGNOSIS — E785 Hyperlipidemia, unspecified: Secondary | ICD-10-CM | POA: Diagnosis not present

## 2015-10-15 DIAGNOSIS — R55 Syncope and collapse: Secondary | ICD-10-CM | POA: Diagnosis not present

## 2015-10-15 DIAGNOSIS — R51 Headache: Secondary | ICD-10-CM | POA: Diagnosis not present

## 2015-10-15 DIAGNOSIS — R21 Rash and other nonspecific skin eruption: Secondary | ICD-10-CM | POA: Diagnosis not present

## 2015-10-15 DIAGNOSIS — Z7984 Long term (current) use of oral hypoglycemic drugs: Secondary | ICD-10-CM | POA: Diagnosis not present

## 2015-10-15 DIAGNOSIS — E1165 Type 2 diabetes mellitus with hyperglycemia: Secondary | ICD-10-CM | POA: Diagnosis not present

## 2015-10-20 ENCOUNTER — Ambulatory Visit (INDEPENDENT_AMBULATORY_CARE_PROVIDER_SITE_OTHER): Payer: Commercial Managed Care - HMO | Admitting: Neurology

## 2015-10-20 ENCOUNTER — Encounter: Payer: Self-pay | Admitting: Neurology

## 2015-10-20 VITALS — BP 112/86 | HR 88 | Resp 20 | Ht 69.0 in | Wt 294.0 lb

## 2015-10-20 DIAGNOSIS — G44321 Chronic post-traumatic headache, intractable: Secondary | ICD-10-CM | POA: Diagnosis not present

## 2015-10-20 DIAGNOSIS — G478 Other sleep disorders: Secondary | ICD-10-CM

## 2015-10-20 DIAGNOSIS — R0683 Snoring: Secondary | ICD-10-CM | POA: Diagnosis not present

## 2015-10-20 DIAGNOSIS — G4710 Hypersomnia, unspecified: Secondary | ICD-10-CM | POA: Diagnosis not present

## 2015-10-20 DIAGNOSIS — E1310 Other specified diabetes mellitus with ketoacidosis without coma: Secondary | ICD-10-CM

## 2015-10-20 DIAGNOSIS — G4730 Sleep apnea, unspecified: Secondary | ICD-10-CM | POA: Diagnosis not present

## 2015-10-20 DIAGNOSIS — E1110 Type 2 diabetes mellitus with ketoacidosis without coma: Secondary | ICD-10-CM

## 2015-10-20 MED ORDER — TOPIRAMATE 25 MG PO TABS: 25.0000 mg | ORAL_TABLET | Freq: Two times a day (BID) | ORAL | Status: DC
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
## 2015-10-20 NOTE — Patient Instructions (Signed)
Dear Taylor Patterson,, I have ordered a brain wave test for you, called EEG, which will help us to distinguish seizures from syncope or other reasons for loss of consciousness. I have proceeded to order a polysomnography and in lab sleep test for you. In addition an MRI of the brain and the medications that should prevent headaches as you are is from coming on more and more frequently. The medication is topiramate.Topiramate extended-release capsules What is this medicine? TOPIRAMATE (toe PYRE a mate) is used to treat seizures in adults or children with epilepsy. This medicine may be used for other purposes; ask your health care provider or pharmacist if you have questions. What should I tell my health care provider before I take this medicine? They need to know if you have any of these conditions: -cirrhosis of the liver or liver disease -diarrhea -glaucoma -kidney stones or kidney disease -lung disease like asthma, obstructive pulmonary disease, emphysema -metabolic acidosis -on a ketogenic diet -scheduled for surgery or a procedure -suicidal thoughts, plans, or attempt; a previous suicide attempt by you or a family member -an unusual or allergic reaction to topiramate, other medicines, foods, dyes, or preservatives -pregnant or trying to get pregnant -breast-feeding How should I use this medicine? Take this medicine by mouth with a glass of water. Follow the directions on the prescription label. Trokendi XR capsules must be swallowed whole. Do not sprinkle on food, break, crush, dissolve, or chew. Qudexy XR capsules may be swallowed whole or opened and sprinkled on a small amount of soft food. This mixture must be swallowed immediately. Do not chew or store mixture for later use. You may take this medicine with meals. Take your medicine at regular intervals. Do not take it more often than directed. Talk to your pediatrician regarding the use of this medicine in children. Special care may be  needed. While Trokendi XR may be prescribed for children as young as 6 years and Qudexy XR may be prescribed for children as young as 2 years for selected conditions, precautions do apply. Overdosage: If you think you have taken too much of this medicine contact a poison control center or emergency room at once. NOTE: This medicine is only for you. Do not share this medicine with others. What if I miss a dose? If you miss a dose, take it as soon as you can. If it is almost time for your next dose, take only that dose. Do not take double or extra doses. What may interact with this medicine? Do not take this medicine with any of the following medications: -probenecid This medicine may also interact with the following medications: -acetazolamide -alcohol -amitriptyline -birth control pills -digoxin -hydrochlorothiazide -lithium -medicines for pain, sleep, or muscle relaxation -metformin -methazolamide -other seizure or epilepsy medicines -pioglitazone -risperidone This list may not describe all possible interactions. Give your health care provider a list of all the medicines, herbs, non-prescription drugs, or dietary supplements you use. Also tell them if you smoke, drink alcohol, or use illegal drugs. Some items may interact with your medicine. What should I watch for while using this medicine? Visit your doctor or health care professional for regular checks on your progress. Do not stop taking this medicine suddenly. This increases the risk of seizures if you are using this medicine to control epilepsy. Wear a medical identification bracelet or chain to say you have epilepsy or seizures, and carry a card that lists all your medicines. This medicine can decrease sweating and increase your body temperature.  Watch for signs of deceased sweating or fever, especially in children. Avoid extreme heat, hot baths, and saunas. Be careful about exercising, especially in hot weather. Contact your health  care provider right away if you notice a fever or decrease in sweating. You should drink plenty of fluids while taking this medicine. If you have had kidney stones in the past, this will help to reduce your chances of forming kidney stones. If you have stomach pain, with nausea or vomiting and yellowing of your eyes or skin, call your doctor immediately. You may get drowsy, dizzy, or have blurred vision. Do not drive, use machinery, or do anything that needs mental alertness until you know how this medicine affects you. To reduce dizziness, do not sit or stand up quickly, especially if you are an older patient. Alcohol can increase drowsiness and dizziness. Avoid alcoholic drinks. Do not drink alcohol for 6 hours before or 6 hours after taking Trokendi XR. If you notice blurred vision, eye pain, or other eye problems, seek medical attention at once for an eye exam. The use of this medicine may increase the chance of suicidal thoughts or actions. Pay special attention to how you are responding while on this medicine. Any worsening of mood, or thoughts of suicide or dying should be reported to your health care professional right away. This medicine may increase the chance of developing metabolic acidosis. If left untreated, this can cause kidney stones, bone disease, or slowed growth in children. Symptoms include breathing fast, fatigue, loss of appetite, irregular heartbeat, or loss of consciousness. Call your doctor immediately if you experience any of these side effects. Also, tell your doctor about any surgery you plan on having while taking this medicine since this may increase your risk for metabolic acidosis. Birth control pills may not work properly while you are taking this medicine. Talk to your doctor about using an extra method of birth control. Women who become pregnant while using this medicine may enroll in the [place] American Antiepileptic Drug Pregnancy Registry by calling (340)[phone]. This  registry collects information about the safety of antiepileptic drug use during pregnancy. What side effects may I notice from receiving this medicine? Side effects that you should report to your doctor or health care professional as soon as possible: -allergic reactions like skin rash, itching or hives, swelling of the face, lips, or tongue -decreased sweating and/or rise in body temperature -depression -difficulty breathing, fast or irregular breathing patterns -difficulty speaking -difficulty walking or controlling muscle movements -hearing impairment -redness, blistering, peeling or loosening of the skin, including inside the mouth -tingling, pain or numbness in the hands or feet -unusually weak or tired -worsening of mood, thoughts or actions of suicide or dying Side effects that usually do not require medical attention (Report these to your doctor or health care professional if they continue or are bothersome.): -altered taste -back pain, joint or muscle aches and pains -diarrhea, or constipation -headache -loss of appetite -nausea -stomach upset, indigestion -tremors This list may not describe all possible side effects. Call your doctor for medical advice about side effects. You may report side effects to FDA at 1-800-FDA-1088. Where should I keep my medicine? Keep out of the reach of children. Store at room temperature between 15 and 30 degrees C (59 and 86 degrees F) in a tightly closed container. Protect from moisture. Throw away any unused medicine after the expiration date. NOTE: This sheet is a summary. It may not cover all possible information. If you have  questions about this medicine, talk to your doctor, pharmacist, or health care provider.    2016, Elsevier/Gold Standard. (2012-09-18 15:33:26)

## 2015-10-24 ENCOUNTER — Ambulatory Visit
Admission: RE | Admit: 2015-10-24 | Discharge: 2015-10-24 | Disposition: A | Discharge status: Home / Self Care | Payer: Commercial Managed Care - HMO | Source: Ambulatory Visit | Attending: Neurology | Admitting: Neurology

## 2015-10-24 DIAGNOSIS — G4710 Hypersomnia, unspecified: Secondary | ICD-10-CM

## 2015-10-24 DIAGNOSIS — E1110 Type 2 diabetes mellitus with ketoacidosis without coma: Secondary | ICD-10-CM

## 2015-10-24 DIAGNOSIS — G4730 Sleep apnea, unspecified: Secondary | ICD-10-CM | POA: Diagnosis not present

## 2015-10-24 DIAGNOSIS — R0683 Snoring: Secondary | ICD-10-CM

## 2015-10-24 DIAGNOSIS — G44321 Chronic post-traumatic headache, intractable: Secondary | ICD-10-CM | POA: Diagnosis not present

## 2015-10-26 ENCOUNTER — Telehealth: Payer: Self-pay

## 2015-10-26 NOTE — Telephone Encounter (Signed)
-----   Message from Melvyn Novasarmen Dohmeier, MD sent at 10/26/2015  3:35 PM EDT ----- Dear Mrs. Northrop ,  Your brain MRI was normal.  Only an incidental finding in the sinus/ mastoid are of your left inner ear was reported, which can correlate with fullness feeling. Jacklynn Ganong. Dohmeier, MD

## 2015-10-26 NOTE — Telephone Encounter (Signed)
Called to discuss MRI results. No answer, left a message asking that she call me back.

## 2015-10-27 NOTE — Telephone Encounter (Signed)
Spoke to pt. I advised her that her MRI brain was normal, however, there was noted to be fluid in the sinuses/mastoid of her left inner ear, which can correlate with a feeling of fullness. Pt verbalized understanding. Pt had no questions at this time but was encouraged to call back if questions arise.

## 2015-10-27 NOTE — Telephone Encounter (Signed)
I called pt again. No answer, left another message asking her to call me back.

## 2015-10-28 ENCOUNTER — Ambulatory Visit (INDEPENDENT_AMBULATORY_CARE_PROVIDER_SITE_OTHER): Payer: Commercial Managed Care - HMO | Admitting: Neurology

## 2015-10-28 DIAGNOSIS — G4730 Sleep apnea, unspecified: Secondary | ICD-10-CM

## 2015-10-28 DIAGNOSIS — G44321 Chronic post-traumatic headache, intractable: Secondary | ICD-10-CM

## 2015-10-28 DIAGNOSIS — R0683 Snoring: Secondary | ICD-10-CM

## 2015-10-28 DIAGNOSIS — G4710 Hypersomnia, unspecified: Secondary | ICD-10-CM

## 2015-10-28 DIAGNOSIS — E1110 Type 2 diabetes mellitus with ketoacidosis without coma: Secondary | ICD-10-CM

## 2015-10-28 DIAGNOSIS — R55 Syncope and collapse: Secondary | ICD-10-CM | POA: Diagnosis not present
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
## 2015-11-02 ENCOUNTER — Telehealth: Payer: Self-pay

## 2015-11-02 NOTE — Telephone Encounter (Signed)
-----   Message from Melvyn Novasarmen Dohmeier, MD sent at 11/02/2015  4:00 PM EDT ----- Normal EEG

## 2015-11-02 NOTE — Telephone Encounter (Signed)
Spoke to pt and advised her that her EEG was normal. Pt verbalized understanding. Pt had no questions at this time but was encouraged to call back if questions arise.

## 2015-11-04 ENCOUNTER — Telehealth: Payer: Self-pay | Admitting: Neurology

## 2015-11-04 NOTE — Telephone Encounter (Signed)
If she is not too sleepy, yes.

## 2015-11-04 NOTE — Telephone Encounter (Signed)
I spoke to pt and advised her that she may drive her car back [place] to [place] if only she is not too sleepy, per Dr. Vickey Hugerohmeier. Pt verbalized understanding.

## 2015-11-04 NOTE — Telephone Encounter (Signed)
Pt sts her car is in [place] and she is having problems getting it home. She is inquiring if Dr Vickey Hugerohmeier will give her permission to drive her car from [place] back to her home in [place].

## 2015-11-08 ENCOUNTER — Ambulatory Visit (INDEPENDENT_AMBULATORY_CARE_PROVIDER_SITE_OTHER): Payer: Commercial Managed Care - HMO | Admitting: Neurology

## 2015-11-08 DIAGNOSIS — G44321 Chronic post-traumatic headache, intractable: Secondary | ICD-10-CM

## 2015-11-08 DIAGNOSIS — E1110 Type 2 diabetes mellitus with ketoacidosis without coma: Secondary | ICD-10-CM

## 2015-11-08 DIAGNOSIS — G4730 Sleep apnea, unspecified: Secondary | ICD-10-CM | POA: Diagnosis not present

## 2015-11-08 DIAGNOSIS — G4710 Hypersomnia, unspecified: Secondary | ICD-10-CM

## 2015-11-08 DIAGNOSIS — R0683 Snoring: Secondary | ICD-10-CM

## 2015-11-09 ENCOUNTER — Encounter (HOSPITAL_COMMUNITY): Payer: Self-pay | Admitting: Emergency Medicine

## 2015-11-09 ENCOUNTER — Emergency Department (HOSPITAL_COMMUNITY)
Admission: EM | Admit: 2015-11-09 | Discharge: 2015-11-09 | Disposition: A | Discharge status: Home / Self Care | Payer: Commercial Managed Care - HMO | Attending: Emergency Medicine | Admitting: Emergency Medicine

## 2015-11-09 DIAGNOSIS — Z91040 Latex allergy status: Secondary | ICD-10-CM | POA: Insufficient documentation

## 2015-11-09 DIAGNOSIS — E119 Type 2 diabetes mellitus without complications: Secondary | ICD-10-CM | POA: Diagnosis not present

## 2015-11-09 DIAGNOSIS — E7800 Pure hypercholesterolemia, unspecified: Secondary | ICD-10-CM | POA: Diagnosis not present

## 2015-11-09 DIAGNOSIS — K219 Gastro-esophageal reflux disease without esophagitis: Secondary | ICD-10-CM | POA: Diagnosis not present

## 2015-11-09 DIAGNOSIS — K0889 Other specified disorders of teeth and supporting structures: Secondary | ICD-10-CM

## 2015-11-09 DIAGNOSIS — Z7984 Long term (current) use of oral hypoglycemic drugs: Secondary | ICD-10-CM | POA: Insufficient documentation

## 2015-11-09 DIAGNOSIS — Z79899 Other long term (current) drug therapy: Secondary | ICD-10-CM | POA: Diagnosis not present

## 2015-11-09 DIAGNOSIS — I10 Essential (primary) hypertension: Secondary | ICD-10-CM | POA: Diagnosis not present

## 2015-11-09 DIAGNOSIS — Z872 Personal history of diseases of the skin and subcutaneous tissue: Secondary | ICD-10-CM | POA: Diagnosis not present

## 2015-11-09 DIAGNOSIS — K08409 Partial loss of teeth, unspecified cause, unspecified class: Secondary | ICD-10-CM | POA: Insufficient documentation

## 2015-11-09 MED ORDER — IBUPROFEN 800 MG PO TABS: 800.0000 mg | ORAL_TABLET | Freq: Three times a day (TID) | ORAL | Status: AC | PRN

## 2015-11-09 MED ORDER — PENICILLIN V POTASSIUM 500 MG PO TABS: 500.0000 mg | ORAL_TABLET | Freq: Three times a day (TID) | ORAL | Status: DC
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
## 2015-11-09 NOTE — ED Notes (Signed)
Declined W/C at D/C and was escorted to lobby by RN. 
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
## 2015-11-09 NOTE — Discharge Instructions (Signed)

## 2015-11-19 ENCOUNTER — Telehealth: Payer: Self-pay

## 2015-11-19 ENCOUNTER — Telehealth: Payer: Self-pay | Admitting: *Deleted

## 2015-11-19 DIAGNOSIS — G4733 Obstructive sleep apnea (adult) (pediatric): Secondary | ICD-10-CM

## 2015-11-19 NOTE — Telephone Encounter (Signed)
I called pt to discuss. No answer, left a message asking her to call me back.  Dr. Vickey Hugerohmeier advised that pt should wait to start cpap and for her compliance visit to start driving. If pt calls back on Friday, please advise pt that I will call her back Monday.

## 2015-11-19 NOTE — Telephone Encounter (Signed)
------------------------------------------------------------   Jaci StandardCaller Briona Vanzanten            CID [id number]  Patient SAME                 Pt's Dr Marylou FlesherHMIER      Area Code 434 Phone# [phone] * DOB [phone]     RE WANTS TO SPEAK WITH THE NURSE NAMED KRISTIN,      CLR WANTS TO KNOW IF SHE CAN BE CLEARED TO DRIVE,CB  Disp:Y/N N If Y = C/B If No Response In 20minutes ============================================================

## 2015-11-19 NOTE — Telephone Encounter (Signed)
Wait for titration and compliance before driving

## 2015-11-19 NOTE — Telephone Encounter (Signed)
Duplicate phone call
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
## 2015-11-19 NOTE — Telephone Encounter (Signed)
I returned pt's call. No answer, left a message asking her to call me back. 

## 2015-11-19 NOTE — Telephone Encounter (Signed)
Pt called requesting to speak with [facility]. RN skypd,she request to call pt back. She can be reached at [phone]

## 2015-11-19 NOTE — Telephone Encounter (Signed)
Spoke to pt. I advised her that her sleep study results revealed osa and treatment is advised. PAP therapy is indicated due to associated hypoxemia. Dr. Vickey Hugerohmeier recommends proceeding with a cpap titration study to optimize therapy. Pt is agreeable to coming in for a cpap titration. Pt understands that our sleep lab will call her to schedule. I advised pt that weight loss and positional therapy are to be entertained. Pt verbalized understanding. I advised pt to avoid sedative-hypnotics which may worsen sleep apnea, alcohol and tobacco. I advised pt to lose weight, diet, and exercise if not contraindicated by her other physicians. Pt verbalized understanding. Pt was advised to not drive or operate hazardous machinery when sleepy. Pt verbalized understanding.\ I advised her that oxygen therapy may be helpful if she does not respond to PAP therapy. Pt verbalized understanding.

## 2015-11-20 NOTE — Telephone Encounter (Signed)
Pt returned Kristen's call °

## 2015-11-23 NOTE — Telephone Encounter (Signed)
I spoke to pt and advised her that Dr. Vickey Hugerohmeier wants pt to start cpap and check compliance on cpap before she will clear pt to drive again. Pt verbalized understanding. Pt is asking when her cpap titration study will be. I will check with the sleep lab.

## 2015-12-01 DIAGNOSIS — R202 Paresthesia of skin: Secondary | ICD-10-CM | POA: Diagnosis not present

## 2015-12-01 DIAGNOSIS — R21 Rash and other nonspecific skin eruption: Secondary | ICD-10-CM | POA: Diagnosis not present

## 2015-12-01 DIAGNOSIS — E1165 Type 2 diabetes mellitus with hyperglycemia: Secondary | ICD-10-CM | POA: Diagnosis not present

## 2015-12-01 DIAGNOSIS — Z7984 Long term (current) use of oral hypoglycemic drugs: Secondary | ICD-10-CM | POA: Diagnosis not present

## 2015-12-04 ENCOUNTER — Ambulatory Visit (INDEPENDENT_AMBULATORY_CARE_PROVIDER_SITE_OTHER): Payer: Commercial Managed Care - HMO | Admitting: Neurology

## 2015-12-04 DIAGNOSIS — G4733 Obstructive sleep apnea (adult) (pediatric): Secondary | ICD-10-CM

## 2015-12-04 DIAGNOSIS — J9692 Respiratory failure, unspecified with hypercapnia: Secondary | ICD-10-CM

## 2015-12-04 DIAGNOSIS — F1123 Opioid dependence with withdrawal: Secondary | ICD-10-CM

## 2015-12-04 DIAGNOSIS — J9691 Respiratory failure, unspecified with hypoxia: Secondary | ICD-10-CM

## 2015-12-04 DIAGNOSIS — E662 Morbid (severe) obesity with alveolar hypoventilation: Secondary | ICD-10-CM

## 2015-12-04 DIAGNOSIS — F11282 Opioid dependence with opioid-induced sleep disorder: Secondary | ICD-10-CM

## 2015-12-04 DIAGNOSIS — F11982 Opioid use, unspecified with opioid-induced sleep disorder: Secondary | ICD-10-CM

## 2015-12-15 ENCOUNTER — Telehealth: Payer: Self-pay

## 2015-12-15 DIAGNOSIS — G4733 Obstructive sleep apnea (adult) (pediatric): Secondary | ICD-10-CM

## 2015-12-15 NOTE — Telephone Encounter (Signed)
Spoke to pt. I advised her that compared to her previous PSG, there was a significant improvement with less respiratory events during titration. CPAP at 6 cm H2O seemed to be effective. Dr. Vickey Hugerohmeier recommends that pt start a cpap. Pt is agreeable to starting cpap but she is asking for a FFM. I advised pt to avoid caffeine containing beverages and chocolate.  I advised pt that [facility] [employer]) and they would be calling her within a week to get her cpap set up. Pt verbalized understanding.

## 2015-12-15 NOTE — Telephone Encounter (Signed)
I called pt to discuss cpap titration results. No answer, left a message asking her to call me back.

## 2015-12-21 ENCOUNTER — Telehealth: Payer: Self-pay | Admitting: *Deleted

## 2015-12-21 NOTE — Telephone Encounter (Signed)
I spoke to Taylor Patterson. She wants to know if Dr. Vickey Hugerohmeier will let her drive. Taylor Patterson has not started her cpap yet, [facility] has not been returning her calls.  Taylor Patterson says, "At this point, I'm about to go crazy" Taylor Patterson wants to be able to drive and would like a letter stating as such.  I advised her that Dr. Vickey Hugerohmeier may advise her not to drive until she has been on cpap for at least 30 days (as we have already advised her). Taylor Patterson wants to know either way, if she is allowed to drive or not.

## 2015-12-21 NOTE — Telephone Encounter (Signed)
Pt ask if Taylor Patterson would give her a call. Please advise [phone].

## 2015-12-22 NOTE — Telephone Encounter (Signed)
30 days of CPAP needed before she can safely drive , assuming control of EDS under therapy.  [facility]- the patient has not received call back?

## 2015-12-22 NOTE — Telephone Encounter (Signed)
I spoke to Taylor Patterson and advised her that Dr. Vickey Hugerohmeier needs 30 days of cpap use before Taylor Patterson can safely drive. At her follow up appt after starting cpap, her degree of sleepiness will need to be checked. [facility] and she should start her cpap next week. Taylor Patterson verbalized understanding.

## 2015-12-30 ENCOUNTER — Telehealth: Payer: Self-pay | Admitting: *Deleted

## 2015-12-30 NOTE — Telephone Encounter (Signed)
[facility]

## 2016-01-06 DIAGNOSIS — G4733 Obstructive sleep apnea (adult) (pediatric): Secondary | ICD-10-CM | POA: Diagnosis not present

## 2016-01-07 ENCOUNTER — Telehealth: Payer: Self-pay | Admitting: Neurology

## 2016-01-07 NOTE — Telephone Encounter (Signed)
I spoke to pt and made her a sooner appt for 02/11/16 at 2pm and cancelled the 8/30 appt. Pt verbalized understanding.

## 2016-01-07 NOTE — Telephone Encounter (Signed)
Patient is returning your call.  

## 2016-01-07 NOTE — Telephone Encounter (Signed)
Patient called, states she was advised by nurse Baxter HireKristen to call when she got CPAP machine and she would try to move her CPAP follow up appointment sooner, currently scheduled for August 30th, started therapy July 5th.

## 2016-01-07 NOTE — Telephone Encounter (Signed)
I called pt. No answer, left a message asking her to call me back. 

## 2016-01-13 ENCOUNTER — Telehealth: Payer: Self-pay | Admitting: *Deleted

## 2016-01-13 NOTE — Telephone Encounter (Signed)
Pt medical records and bills [facility]. Attn Ashley ext [phone]

## 2016-01-17 DIAGNOSIS — F329 Major depressive disorder, single episode, unspecified: Secondary | ICD-10-CM | POA: Diagnosis not present

## 2016-01-17 DIAGNOSIS — Z7984 Long term (current) use of oral hypoglycemic drugs: Secondary | ICD-10-CM | POA: Diagnosis not present

## 2016-01-17 DIAGNOSIS — E7800 Pure hypercholesterolemia, unspecified: Secondary | ICD-10-CM | POA: Diagnosis not present

## 2016-01-17 DIAGNOSIS — R21 Rash and other nonspecific skin eruption: Secondary | ICD-10-CM | POA: Diagnosis not present

## 2016-01-17 DIAGNOSIS — L239 Allergic contact dermatitis, unspecified cause: Secondary | ICD-10-CM | POA: Diagnosis not present

## 2016-01-17 DIAGNOSIS — Z79899 Other long term (current) drug therapy: Secondary | ICD-10-CM | POA: Diagnosis not present

## 2016-01-17 DIAGNOSIS — F419 Anxiety disorder, unspecified: Secondary | ICD-10-CM | POA: Diagnosis not present

## 2016-01-17 DIAGNOSIS — E119 Type 2 diabetes mellitus without complications: Secondary | ICD-10-CM | POA: Diagnosis not present

## 2016-01-17 DIAGNOSIS — K219 Gastro-esophageal reflux disease without esophagitis: Secondary | ICD-10-CM | POA: Diagnosis not present

## 2016-02-06 DIAGNOSIS — G4733 Obstructive sleep apnea (adult) (pediatric): Secondary | ICD-10-CM | POA: Diagnosis not present

## 2016-02-11 ENCOUNTER — Ambulatory Visit: Payer: Self-pay | Admitting: Neurology

## 2016-02-16 ENCOUNTER — Encounter: Payer: Self-pay | Admitting: Neurology

## 2016-02-16 ENCOUNTER — Ambulatory Visit (INDEPENDENT_AMBULATORY_CARE_PROVIDER_SITE_OTHER): Payer: Commercial Managed Care - HMO | Admitting: Neurology

## 2016-02-16 VITALS — BP 110/70 | HR 80 | Resp 20 | Ht 66.0 in | Wt 287.0 lb

## 2016-02-16 DIAGNOSIS — G4710 Hypersomnia, unspecified: Secondary | ICD-10-CM

## 2016-02-16 DIAGNOSIS — G4730 Sleep apnea, unspecified: Secondary | ICD-10-CM

## 2016-02-16 DIAGNOSIS — G4733 Obstructive sleep apnea (adult) (pediatric): Secondary | ICD-10-CM | POA: Insufficient documentation

## 2016-02-16 DIAGNOSIS — Z9989 Dependence on other enabling machines and devices: Principal | ICD-10-CM

## 2016-02-16 MED ORDER — TRIAMCINOLONE ACETONIDE 0.5 % EX CREA: 15.0000 "application " | TOPICAL_CREAM | Freq: Every day | CUTANEOUS | 3 refills | Status: DC
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
## 2016-02-16 NOTE — Patient Instructions (Addendum)
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   [place], [place] on  02/16/2016,  sincerely   Dr. Porfirio Mylar Xayne Brumbaugh

## 2016-02-24 ENCOUNTER — Telehealth: Payer: Self-pay | Admitting: Neurology

## 2016-02-24 MED ORDER — TRIAMCINOLONE ACETONIDE 0.5 % EX CREA: 1.0000 "application " | TOPICAL_CREAM | Freq: Every day | CUTANEOUS | 3 refills | Status: DC

## 2016-02-24 NOTE — Addendum Note (Signed)
Addended by: Geronimo RunningINKINS, Lynne Takemoto A on: 02/24/2016 02:48 PM   Modules accepted: Orders

## 2016-02-24 NOTE — Telephone Encounter (Signed)
I spoke to pt. She says that [facility] and [facility] have [facility] the triamcinolone ointment.

## 2016-02-24 NOTE — Telephone Encounter (Signed)
Patient is calling back stating that [facility] has not received the Rx for triamcinolone cream (KENALOG) 0.5 %. She wanted to check and see if we have the correct fax number.[facility]'s  fax number is  [phone].

## 2016-02-24 NOTE — Telephone Encounter (Signed)
I spoke to pt and advised her that the triamcinolone cream RX was fixed and faxed back to [facility] yesterday. Pt verbalized understanding.

## 2016-02-24 NOTE — Telephone Encounter (Signed)
Patient called to check status of Rx for triamcinolone cream (KENALOG) 0.5 % to [facility]. Pharmacy called patient yesterday to advise, Rx has not been received.

## 2016-03-02 ENCOUNTER — Ambulatory Visit: Payer: Self-pay | Admitting: Neurology

## 2016-03-08 DIAGNOSIS — G4733 Obstructive sleep apnea (adult) (pediatric): Secondary | ICD-10-CM | POA: Diagnosis not present

## 2016-03-09 ENCOUNTER — Other Ambulatory Visit: Payer: Self-pay | Admitting: Neurology

## 2016-03-25 ENCOUNTER — Other Ambulatory Visit: Payer: Self-pay

## 2016-03-25 NOTE — Patient Outreach (Signed)
[facility](THN) Care Management  03/25/2016  Patty SermonsChristina Stowe Tarazon 10-Nov-1975 [id number]  REFERRAL DATE: 03/23/16  REFERRAL SOURCE:  EMMI Prevent REFERRAL REASON: 1 ED visit within the past few months and diabetes diagnosis  Telephone call to patient regarding EMMI Prevent referral.  Unable to reach patient. HIPAA compliant voice message left with call back phone number.   PLAN; RNCM will attempt 2nd telephone call to patient within 1 week.   George InaDavina Lorina Duffner RN,[name],CCM [facility] Telephonic  [phone]

## 2016-03-29 ENCOUNTER — Other Ambulatory Visit: Payer: Self-pay

## 2016-03-29 NOTE — Patient Outreach (Signed)
[facility](THN) Care Management  03/29/2016  Taylor SermonsChristina Stowe Patterson 1975/12/29 [id number]   REFERRAL DATE: 03/23/16  REFERRAL SOURCE: EMMI prevent REFERRAL REASON:  Diabetes CONSENT: self/ patient  SUBJECTIVE;  Telephone call to patient regarding EMMI prevent referral. HIPAA verified with patient. Discussed and offered [facility] care management services to patient.  Patient verbally agreed to services.   DIABETES:  Patient states she has had diabetes for approximately 10 years. Patient states she has not had any formal diabetic education.  Patient states she has been followed by her doctor. Patient states her blood sugar range over the last several months have been from the 100's to 500's.  Patient states more recently her  Blood sugars have been in the 100-200's.  Patient states she checks her blood sugars approximately 2-3 times per day.  Patient states her last A1c approximately 6 months ago was 14.  Patient states she needs to make a follow up appointment with her primary to have her A1c done again. Patient states her primary MD signed her up for nutrition classes in July 2017.  Patient states she was unable to attend the classes because she became sick. Patient states she has not had any other follow up regarding the nutrition classes. Patient states she needs to learn about other alternatives regarding her meal plans.  Patient states she eats a lot of the same things. Patient reports she has numbness and tingling in her fingers and feet. Patient states she has not been diagnosed with neuropathy.Patient states she had a leg ulcer to her right leg for sometime but the ulcer has started healing over.  Patient states she takes her medications as prescribed. Patient reports she [facility]. Patient states she is able to afford her medications.  Patient states she is having problems with her car.  Patient states she has difficulty at times getting to her  doctors appointment because she and her husband have 1 car. Patient states she would like assistance with transportation.  Patient reports she is interested in an Advance directive.  Patient reports she sees a psychiatrist, Dr. Melony Overlyeresa Patterson.   ASSESSMENT: Patient would benefit from referral to health coach for diabetes management. Patient will benefit from referral to social worker for transportation assistance and assistance with Advance directive. Patient needs follow up with her primary MD.   PLAN;  RNCM will refer patient to health coach and social worker.   George InaDavina Danika Kluender RN,[name],CCM [facility] Telephonic  [phone]

## 2016-04-01 ENCOUNTER — Other Ambulatory Visit: Payer: Self-pay | Admitting: *Deleted

## 2016-04-01 NOTE — Patient Outreach (Signed)
[facility](THN) Care Management  04/01/2016  Taylor Patterson Jan 22, 1976 [id number]  CSW made an initial attempt to try and contact patient today to perform phone assessment, as well as assess and assist with social needs and services, without success.  A HIPAA complaint message was left for patient on voicemail. Will make phone attempt again next week if no return call is rec'd.   Reece LevyJanet Noris Kulinski, MSW, LCSW Clinical Social Worker  [facility] [phone]

## 2016-04-04 ENCOUNTER — Other Ambulatory Visit: Payer: Self-pay | Admitting: *Deleted

## 2016-04-04 NOTE — Patient Outreach (Signed)
[facility](THN) Care Management  04/04/2016  Patty SermonsChristina Stowe Mehaffey Nov 19, 1975 [id number]   CSW was able to make initial contact with patient today to perform phone assessment, as well as assess and assist with social work needs and services.  CSW introduced self, explained role and types of services provided through [facility] Care Management [facility](THN Care Management).  CSW further explained to patient that CSW works with patient's RNCM, also with [facility], . CSW then explained the reason for the call, indicating that patient's RNCM thought that patient would benefit from social work services and resources to assist with .transportation and Merchant navy officerAdvance Directives.  CSW obtained two HIPAA compliant identifiers from patient, which included patient's name and date of birth. Patient agrees to CSW visit- planned visit for 04/13/16.   Reece LevyJanet Yuridia Couts, MSW, LCSW Clinical Social Worker  [facility] [phone]

## 2016-04-07 DIAGNOSIS — G4733 Obstructive sleep apnea (adult) (pediatric): Secondary | ICD-10-CM | POA: Diagnosis not present

## 2016-04-08 DIAGNOSIS — G4733 Obstructive sleep apnea (adult) (pediatric): Secondary | ICD-10-CM | POA: Diagnosis not present

## 2016-04-12 DIAGNOSIS — F331 Major depressive disorder, recurrent, moderate: Secondary | ICD-10-CM | POA: Diagnosis not present

## 2016-04-13 ENCOUNTER — Other Ambulatory Visit: Payer: Self-pay

## 2016-04-13 ENCOUNTER — Other Ambulatory Visit: Payer: Self-pay | Admitting: *Deleted

## 2016-04-13 NOTE — Patient Outreach (Signed)
[facility](THN) Care Management  04/13/2016  Taylor Patterson 01/08/1976 [id number]   Unsuccessful attempt to reach patient by phone.  HIPAA appropriate message left. RN will make another attempt to reach patient within one week.  [name] Marlon Suleiman, RN, MSN RN Edison InternationalHealth Coach [facility] [phone] Fax [phone]

## 2016-04-14 ENCOUNTER — Encounter: Payer: Self-pay | Admitting: *Deleted

## 2016-04-14 NOTE — Patient Outreach (Signed)
[facility]) Care Management  [facility] Social Work  04/14/2016  Beautifull Cisar 1976/02/24 [id number]  Subjective:  Patient requesting assistance with Advance Directives and transportation.    Encounter Medications:  Outpatient Encounter Prescriptions as of 04/13/2016  Medication Sig  . ALPRAZolam (XANAX) 0.5 MG tablet Take 0.5 mg by mouth daily as needed for anxiety.  Marland Kitchen atorvastatin (LIPITOR) 20 MG tablet Take 20 mg by mouth daily at 6 PM.  . cetirizine (ZYRTEC) 10 MG tablet Take 10 mg by mouth daily as needed for allergies.  . furosemide (LASIX) 20 MG tablet Take 20 mg by mouth.  Marland Kitchen glimepiride (AMARYL) 2 MG tablet Take 2 mg by mouth daily with breakfast.  . HYDROcodone-acetaminophen (NORCO/VICODIN) 5-325 MG tablet Take 1 tablet by mouth every 6 (six) hours as needed for moderate pain.  Marland Kitchen ibuprofen (ADVIL,MOTRIN) 800 MG tablet Take 1 tablet (800 mg total) by mouth every 8 (eight) hours as needed for moderate pain.  . metFORMIN (GLUCOPHAGE) 500 MG tablet Take 1,000 mg by mouth 2 (two) times daily with a meal.   . pantoprazole (PROTONIX) 40 MG tablet Take 40 mg by mouth daily.  Marland Kitchen topiramate (TOPAMAX) 25 MG tablet Take 1 tablet (25 mg total) by mouth 2 (two) times daily.  Marland Kitchen triamcinolone cream (KENALOG) 0.5 % Apply 1 application topically at bedtime. Apply to face   No facility-administered encounter medications on file as of 04/13/2016.     Functional Status:  In your present state of health, do you have any difficulty performing the following activities: 04/14/2016  Hearing? N  Vision? N  Difficulty concentrating or making decisions? N  Walking or climbing stairs? N  Dressing or bathing? N  Doing errands, shopping? N  Preparing Food and eating ? N  Using the Toilet? N  In the past six months, have you accidently leaked urine? N  Do you have problems with loss of bowel control? N  Managing your Medications? N  Managing your Finances? N  Housekeeping or  managing your Housekeeping? N  Some recent data might be hidden    Fall/Depression Screening:  PHQ 2/9 Scores 04/13/2016 03/29/2016 10/31/2014  PHQ - 2 Score 6 4 0  PHQ- 9 Score 10 6 -    Assessment:  CSW met with patient in her home. She reports that she lives with her husband who works, as well as a step son.  Patient reports she is on disability for her legs and diabetes- "can't be up on my feet a lot or my leg wound opens up."   CSW discussed community resources that may be of help with transportation as well as for vocational options as she is taking online college courses and "want to work and have my name on a door".  CSW provided Advance Directives packet to patient today. We discussed the importance of "having this completed before it's needed."  Patient acknowledged this and plans to review and complete. CSW discussed area community locales that may assist with notarizing documents.   Patient also shared with CSW her history of depression and anxiety; discussed her feelings of low self worth (unable to work) and her concerns related to helping her husband who struggles from his childhood with similar issues- patient is on anti-depressant and anxiety medications; she reports her Zoloft was just increased recently and she is hoping for better management /balance.   She is taking online college courses and is hopeful to be able to fine part time work that will also  help with finances but also her self worth.  Patient tearful but optimistic.   Plan:  CSW plans to follow up with patient in 2 weeks for update and further planning.    [facility] CM Care Plan Problem One   Flowsheet Row Most Recent Value  Care Plan Problem One  Patient without HCPOA and requesting assistance obtaining/completing documents.   Role Documenting the Problem One  Clinical Social Worker  Care Plan for Problem One  Active  THN Long Term Goal (31-90 days)  Patient will have Advance Directive documents completed and  notarixed within 90 days.  THN Long Term Goal Start Date  04/13/16  Interventions for Problem One Long Term Goal  CSW provided and discussed locations for obtaining Notary assistance.   THN CM Short Term Goal #1 (0-30 days)  Member will obtain Advance Directive packet and review and complete within 30 days.   THN CM Short Term Goal #1 Start Date  04/13/16  Interventions for Short Term Goal #1   CSW provided Advance Directive packet to patient and explained documents and process for completion/notarizing.     Eduard Clos, MSW, [place] Worker  [place] [phone]

## 2016-04-19 ENCOUNTER — Other Ambulatory Visit: Payer: Self-pay

## 2016-04-19 DIAGNOSIS — E1110 Type 2 diabetes mellitus with ketoacidosis without coma: Secondary | ICD-10-CM

## 2016-04-19 NOTE — Patient Outreach (Signed)
[facility]) Care Management         [facility](THN) Care Management  04/19/2016  Taylor SermonsChristina Stowe Patterson Taylor Patterson, Taylor Patterson [id number]   Second unsuccessful attempt to reach patient by phone for initial assessment. HIPAA appropriate message left with contact information.  RN will make another attempt within one week.  [name] Roderick Sweezy, RN, MSN RN Edison InternationalHealth Coach [facility] [phone] Fax [phone]

## 2016-04-20 ENCOUNTER — Other Ambulatory Visit: Payer: Self-pay

## 2016-04-20 NOTE — Patient Outreach (Signed)
[facility](THN) Care Management  04/20/2016  Taylor Patterson Aug 31, 1975 [id number]  Third unsuccessful attempt to reach patient.  HIPAA appropriate message left requesting call back.  Plan:  RN will check with MSW Taylor LevyJanet Patterson, who has been in touch with patient, before closing.  Taylor Deisonnie Miral Hoopes, RN, MSN RN Edison InternationalHealth Coach [facility] [phone] Fax [phone]

## 2016-04-22 NOTE — Patient Outreach (Signed)
[facility](THN) Care Management  04/22/2016  Patty SermonsChristina Stowe Brandl 11/26/1975 [id number]   Request received from Reece LevyJanet Caldwell, LCSW to mail patient Medicaid application, SCAT information, Voc Rehab Info and Food pantry information. Information mailed today, 04/22/16  Sherle PoeNicole Adlai Sinning, Conrad BurlingtonB.A.  [facility] Care Management Assistant

## 2016-05-03 ENCOUNTER — Ambulatory Visit: Payer: Self-pay | Admitting: *Deleted

## 2016-05-05 ENCOUNTER — Other Ambulatory Visit: Payer: Self-pay

## 2016-05-05 VITALS — Ht 66.0 in

## 2016-05-05 DIAGNOSIS — E1110 Type 2 diabetes mellitus with ketoacidosis without coma: Secondary | ICD-10-CM

## 2016-05-05 NOTE — Patient Outreach (Signed)
[facility]) Care Management  [facility] Care Manager  05/05/2016   Taylor Patterson June 14, 1976 [id number]  Initial assessment completed today.  RN had been unsuccessful in previous attempts to contact patient. Patient reports ten year history of diabetes, and that most recent A1C was 14.  Patient reports she has PCP appointment tomorrow and she will get a recheck of her A1C.  Patient expresses desire to improve her diabetic self-management.  States she has never had education about diabetic management.  Encounter Medications:  Outpatient Encounter Prescriptions as of 05/05/2016  Medication Sig Note  . buPROPion (WELLBUTRIN XL) 300 MG 24 hr tablet Take 300 mg by mouth daily.   Marland Kitchen. ibuprofen (ADVIL,MOTRIN) 800 MG tablet Take 1 tablet (800 mg total) by mouth every 8 (eight) hours as needed for moderate pain. 05/05/2016: States it does not help.  . sertraline (ZOLOFT) 100 MG tablet Take 100 mg by mouth daily.   Marland Kitchen. ALPRAZolam (XANAX) 0.5 MG tablet Take 0.5 mg by mouth daily as needed for anxiety.   Marland Kitchen. atorvastatin (LIPITOR) 20 MG tablet Take 20 mg by mouth daily at 6 PM.   . cetirizine (ZYRTEC) 10 MG tablet Take 10 mg by mouth daily as needed for allergies.   . furosemide (LASIX) 20 MG tablet Take 20 mg by mouth.   Marland Kitchen. glimepiride (AMARYL) 2 MG tablet Take 2 mg by mouth daily with breakfast.   . HYDROcodone-acetaminophen (NORCO/VICODIN) 5-325 MG tablet Take 1 tablet by mouth every 6 (six) hours as needed for moderate pain.   . metFORMIN (GLUCOPHAGE) 500 MG tablet Take 1,000 mg by mouth 2 (two) times daily with a meal.    . pantoprazole (PROTONIX) 40 MG tablet Take 40 mg by mouth daily.   Marland Kitchen. topiramate (TOPAMAX) 25 MG tablet Take 1 tablet (25 mg total) by mouth 2 (two) times daily.   Marland Kitchen. triamcinolone cream (KENALOG) 0.5 % Apply 1 application topically at bedtime. Apply to face    No facility-administered encounter medications on file as of 05/05/2016.     Functional Status:  In your  present state of health, do you have any difficulty performing the following activities: 05/05/2016 04/14/2016  Hearing? N N  Vision? N N  Difficulty concentrating or making decisions? N N  Walking or climbing stairs? N N  Dressing or bathing? N N  Doing errands, shopping? N N  Preparing Food and eating ? N N  Using the Toilet? N N  In the past six months, have you accidently leaked urine? N N  Do you have problems with loss of bowel control? N N  Managing your Medications? N N  Managing your Finances? N N  Housekeeping or managing your Housekeeping? N N  Some recent data might be hidden    Fall/Depression Screening: PHQ 2/9 Scores 05/05/2016 04/13/2016 03/29/2016 10/31/2014  PHQ - 2 Score 2 6 4  0  PHQ- 9 Score 3 10 6  -    Assessment: Self-management needs related to Diabetes. THN CM Care Plan Problem Two   Flowsheet Row Most Recent Value  Care Plan Problem Two  Chronic disease self-management issues related to diabetes  Role Documenting the Problem Two  Health Coach  Care Plan for Problem Two  Active  Interventions for Problem Two Long Term Goal   Recommended she request referral for Diabetes classes.  Will mail EMMI articles on diabetes self-management.  THN Long Term Goal (31-90) days  Patient's A1C  will decrease by 2 or more points in the next  90 days.  THN Long Term Goal Start Date  05/05/16  THN CM Short Term Goal #1 (0-30 days)  Patient will be able to discuss diabetic dietary guidelines within 30 days.  THN CM Short Term Goal #1 Start Date  05/05/16  Interventions for Short Term Goal #2   Education provided re limiting carbohydrates.  Will mail EMMI articles on dietary guidelines.  Encouraged patient to ask for referral for classes.      Plan: Patient will keep PCP appointment scheduled for 05-06-16.           [facility].           Patient will review EMMI articles when she receives them, for discussion at our  next contact.           RN will mail EMMI educational articles on dietary guidelines.           RN will follow up next month.  [name] Desa Rech, RN, MSN RN Edison InternationalHealth Coach [facility] [phone] Fax [phone]

## 2016-05-06 ENCOUNTER — Other Ambulatory Visit: Payer: Self-pay | Admitting: *Deleted

## 2016-05-06 DIAGNOSIS — F339 Major depressive disorder, recurrent, unspecified: Secondary | ICD-10-CM | POA: Diagnosis not present

## 2016-05-06 DIAGNOSIS — F419 Anxiety disorder, unspecified: Secondary | ICD-10-CM | POA: Diagnosis not present

## 2016-05-06 DIAGNOSIS — R110 Nausea: Secondary | ICD-10-CM | POA: Diagnosis not present

## 2016-05-06 DIAGNOSIS — Z7984 Long term (current) use of oral hypoglycemic drugs: Secondary | ICD-10-CM | POA: Diagnosis not present

## 2016-05-06 DIAGNOSIS — E1165 Type 2 diabetes mellitus with hyperglycemia: Secondary | ICD-10-CM | POA: Diagnosis not present

## 2016-05-06 DIAGNOSIS — M519 Unspecified thoracic, thoracolumbar and lumbosacral intervertebral disc disorder: Secondary | ICD-10-CM | POA: Diagnosis not present

## 2016-05-06 DIAGNOSIS — K219 Gastro-esophageal reflux disease without esophagitis: Secondary | ICD-10-CM | POA: Diagnosis not present

## 2016-05-06 DIAGNOSIS — E785 Hyperlipidemia, unspecified: Secondary | ICD-10-CM | POA: Diagnosis not present

## 2016-05-06 NOTE — Patient Outreach (Signed)
[facility](THN) Care Management  05/06/2016  Patty SermonsChristina Stowe Coyle Nov 01, 1975 [id number]   Patient reports she has been sick and now has a sick kid. "School is going ok and her period/cramps are making it harder".  Patient reports she has not had chance to review or look into the resources provided.  CSW offered f/u call in about 2 weeks to check in.   No concerns or issues identified or communicated.          Reece LevyJanet Alynna Hargrove, MSW, LCSW Clinical Social Worker  [facility] [phone]

## 2016-05-08 DIAGNOSIS — G4733 Obstructive sleep apnea (adult) (pediatric): Secondary | ICD-10-CM | POA: Diagnosis not present

## 2016-05-19 DIAGNOSIS — G8929 Other chronic pain: Secondary | ICD-10-CM | POA: Diagnosis not present

## 2016-05-19 DIAGNOSIS — M5442 Lumbago with sciatica, left side: Secondary | ICD-10-CM | POA: Diagnosis not present

## 2016-05-20 DIAGNOSIS — F331 Major depressive disorder, recurrent, moderate: Secondary | ICD-10-CM | POA: Diagnosis not present

## 2016-06-01 ENCOUNTER — Other Ambulatory Visit: Payer: Self-pay

## 2016-06-01 NOTE — Patient Outreach (Signed)
[facility](THN) Care Management  06/01/2016  Patty SermonsChristina Stowe Dugdale [birth year] [id number]  Unsuccessful attempt to reach patient.  HIPAA appropriate message left. RN will make another attempt within 10 days.  [name] Marquelle Balow, RN, MSN RN Edison InternationalHealth Coach [facility] [phone] Fax [phone]

## 2016-06-02 ENCOUNTER — Ambulatory Visit: Payer: Self-pay

## 2016-06-07 DIAGNOSIS — G4733 Obstructive sleep apnea (adult) (pediatric): Secondary | ICD-10-CM | POA: Diagnosis not present

## 2016-06-14 ENCOUNTER — Other Ambulatory Visit: Payer: Self-pay

## 2016-06-14 NOTE — Patient Outreach (Signed)
[facility](THN) Care Management  06/14/2016  Patty SermonsChristina Stowe Spitler 1975-12-31 [id number]  Second unsuccessful attempt to reach patient.  HIPAA appropriate message left requesting call back.  If no response RN will make another attempt within 10 days.  [name] Kayce Chismar, RN, MSN RN [employer] InternationalHealth Coach [facility] [phone] Fax [phone]

## 2016-06-15 ENCOUNTER — Other Ambulatory Visit: Payer: Self-pay

## 2016-06-15 NOTE — Patient Outreach (Signed)
[facility](THN) Care Management  06/15/2016  Taylor Patterson 30-Aug-1975 [id number]   Third unsuccessful attempt to reach patient by phone.  HIPPA appropriate message left requesting call back.  If no response within 10 days, RN will perform a case closure.  [name] [name], RN, MSN RN [employer] InternationalHealth Coach [facility] [phone] Fax [phone]

## 2016-06-16 ENCOUNTER — Other Ambulatory Visit: Payer: Self-pay

## 2016-06-16 DIAGNOSIS — E1110 Type 2 diabetes mellitus with ketoacidosis without coma: Secondary | ICD-10-CM

## 2016-06-16 NOTE — Patient Outreach (Signed)
[facility](THN) Care Management  06/16/2016  Patty SermonsChristina Stowe Avitia March 17, 1976 [id number]  Received call back from patient from message left earlier this week.  Patient reports cbgs have fluctuated up and down, but that overall she feels she has made some progress.  One episode of hyperglycemia reported with a cbg of 600.  She is happy about the A1C decrease from 14 to 10.3.  She reports her weight is now down to 270 pounds.  Patient reports she has been referred to an orthopedist due to her chronic back pain.  She is to have an MRI.  She also states she is to be referred to a GI doctor for reflux, which is not well controlled with Protonix.  Plan:  Patient will concentrate on healthy food choices during the holidays, especially while visiting her family in [place].           RN will follow up in January.  [name] Dusty Wagoner, RN, MSN RN [employer] InternationalHealth Coach [facility] [phone] Fax [phone]

## 2016-07-08 DIAGNOSIS — G4733 Obstructive sleep apnea (adult) (pediatric): Secondary | ICD-10-CM | POA: Diagnosis not present

## 2016-07-15 DIAGNOSIS — G4733 Obstructive sleep apnea (adult) (pediatric): Secondary | ICD-10-CM | POA: Diagnosis not present

## 2016-07-20 ENCOUNTER — Other Ambulatory Visit: Payer: Self-pay

## 2016-07-20 DIAGNOSIS — E1110 Type 2 diabetes mellitus with ketoacidosis without coma: Secondary | ICD-10-CM

## 2016-07-20 NOTE — Patient Outreach (Signed)
[facility]N) Care Management  07/20/2016  Taylor SermonsChristina Stowe Patterson [birth year] [id number]  Telephonic assessment today.  Patient reports she enjoyed her trip to see family in [place] over Christmas, and reports she feels she adhered to her dietary guidelines at least 50% of the time.  She is not able to report her current weight because she has no scales.  Patient reports a recent fall on her porch, which resulted in a bruised rt arm and knee.  She reports an open draining wound on her knee due to the fall, which she is cleaning and dressing daily.  Plan:  Patient will notify PCP of any s/s infection in the knee wound.           Patient is waiting for scheduling of an MRI to assess her back pain, as well as a gastroenterology referral for GERD.           Patient will continue efforts to adhere to dietary guidelines to manage blood sugars and weight loss goals.           RN will follow up in February.  Taylor [name], RN, MSN RN Taylor InternationalHealth Coach [facility] [phone] Fax [phone]

## 2016-07-26 ENCOUNTER — Other Ambulatory Visit: Payer: Self-pay | Admitting: *Deleted

## 2016-07-26 NOTE — Patient Outreach (Signed)
[facility]) Care Management  [facility] Social Work  07/26/2016  Taylor Patterson [birth year] [id number]  Subjective:  "I started back to school this semester".     Objective:  Patient reports she is doing well- fell recently and "skinned" herself up- states she has been treating it herself with ointment.  Current Medications:  Current Outpatient Prescriptions  Medication Sig Dispense Refill  . ALPRAZolam (XANAX) 0.5 MG tablet Take 0.5 mg by mouth daily as needed for anxiety.    Marland Kitchen atorvastatin (LIPITOR) 20 MG tablet Take 20 mg by mouth daily at 6 PM.    . buPROPion (WELLBUTRIN XL) 300 MG 24 hr tablet Take 300 mg by mouth daily.    . cetirizine (ZYRTEC) 10 MG tablet Take 10 mg by mouth daily as needed for allergies.    . furosemide (LASIX) 20 MG tablet Take 20 mg by mouth.    Marland Kitchen glimepiride (AMARYL) 2 MG tablet Take 2 mg by mouth daily with breakfast.    . HYDROcodone-acetaminophen (NORCO/VICODIN) 5-325 MG tablet Take 1 tablet by mouth every 6 (six) hours as needed for moderate pain.    Marland Kitchen ibuprofen (ADVIL,MOTRIN) 800 MG tablet Take 1 tablet (800 mg total) by mouth every 8 (eight) hours as needed for moderate pain. 21 tablet 0  . metFORMIN (GLUCOPHAGE) 500 MG tablet Take 1,000 mg by mouth 2 (two) times daily with a meal.     . pantoprazole (PROTONIX) 40 MG tablet Take 40 mg by mouth daily.    . sertraline (ZOLOFT) 100 MG tablet Take 100 mg by mouth daily.    Marland Kitchen topiramate (TOPAMAX) 25 MG tablet Take 1 tablet (25 mg total) by mouth 2 (two) times daily. 120 tablet 3  . triamcinolone cream (KENALOG) 0.5 % Apply 1 application topically at bedtime. Apply to face 30 g 3   No current facility-administered medications for this visit.     Functional Status:  In your present state of health, do you have any difficulty performing the following activities: 05/05/2016 04/14/2016  Hearing? N N  Vision? N N  Difficulty concentrating or making decisions? N N  Walking or climbing  stairs? N N  Dressing or bathing? N N  Doing errands, shopping? N N  Preparing Food and eating ? N N  Using the Toilet? N N  In the past six months, have you accidently leaked urine? N N  Do you have problems with loss of bowel control? N N  Managing your Medications? N N  Managing your Finances? N N  Housekeeping or managing your Housekeeping? N N  Some recent data might be hidden    Fall/Depression Screening:  PHQ 2/9 Scores 05/05/2016 04/13/2016 03/29/2016 10/31/2014  PHQ - 2 Score 2 6 4  0  PHQ- 9 Score 3 10 6  -    Assessment: CSW called patient to follow up regarding resources dicussed and information mailed to her for follow up. "I go back to see my mental health care provider February 5th". She denies any bouts of depression or SI- she is in good spirits. She has been driving some and is planning to look into the [facility], advance directive info provided by this CSW in the near future.  She denies any further needs or concerns for CSW. CSW will plan case closure at this time- discussed with patient and she is agreeable as well.   Plan: CSW will close case and advise PCP and [facility] Team.    [name], MSW, LCSW Clinical Social  Worker  [facility] [employer] [phone]

## 2016-08-08 DIAGNOSIS — G4733 Obstructive sleep apnea (adult) (pediatric): Secondary | ICD-10-CM | POA: Diagnosis not present

## 2016-08-08 DIAGNOSIS — F331 Major depressive disorder, recurrent, moderate: Secondary | ICD-10-CM | POA: Diagnosis not present

## 2016-08-12 DIAGNOSIS — Z7984 Long term (current) use of oral hypoglycemic drugs: Secondary | ICD-10-CM | POA: Diagnosis not present

## 2016-08-12 DIAGNOSIS — K029 Dental caries, unspecified: Secondary | ICD-10-CM | POA: Diagnosis not present

## 2016-08-12 DIAGNOSIS — F339 Major depressive disorder, recurrent, unspecified: Secondary | ICD-10-CM | POA: Diagnosis not present

## 2016-08-12 DIAGNOSIS — E1165 Type 2 diabetes mellitus with hyperglycemia: Secondary | ICD-10-CM | POA: Diagnosis not present

## 2016-08-16 DIAGNOSIS — R220 Localized swelling, mass and lump, head: Secondary | ICD-10-CM | POA: Diagnosis not present

## 2016-08-16 DIAGNOSIS — R21 Rash and other nonspecific skin eruption: Secondary | ICD-10-CM | POA: Diagnosis not present

## 2016-08-16 DIAGNOSIS — E11638 Type 2 diabetes mellitus with other oral complications: Secondary | ICD-10-CM | POA: Diagnosis not present

## 2016-08-16 DIAGNOSIS — G4733 Obstructive sleep apnea (adult) (pediatric): Secondary | ICD-10-CM | POA: Diagnosis not present

## 2016-08-16 DIAGNOSIS — T50905A Adverse effect of unspecified drugs, medicaments and biological substances, initial encounter: Secondary | ICD-10-CM | POA: Diagnosis not present

## 2016-08-16 DIAGNOSIS — Z91012 Allergy to eggs: Secondary | ICD-10-CM | POA: Diagnosis not present

## 2016-08-16 DIAGNOSIS — T360X5A Adverse effect of penicillins, initial encounter: Secondary | ICD-10-CM | POA: Diagnosis not present

## 2016-08-16 DIAGNOSIS — K047 Periapical abscess without sinus: Secondary | ICD-10-CM | POA: Diagnosis not present

## 2016-08-16 DIAGNOSIS — K219 Gastro-esophageal reflux disease without esophagitis: Secondary | ICD-10-CM | POA: Diagnosis not present

## 2016-08-17 ENCOUNTER — Other Ambulatory Visit: Payer: Self-pay

## 2016-08-17 VITALS — Wt 265.0 lb

## 2016-08-17 DIAGNOSIS — E1110 Type 2 diabetes mellitus with ketoacidosis without coma: Secondary | ICD-10-CM

## 2016-08-17 NOTE — Patient Outreach (Signed)
[facility]) Care Management  08/17/2016  Taylor SermonsChristina Stowe Patterson 1975-10-19 [id number]   Telephone contact with patient.  She reports she went to the ED yesterday because her face was swollen and painful.  She reports she was told she probably has an abscessed tooth, and that she is waiting for a dental appointment.  Patient still has no scales, but reports her weight yesterday at the ED was 265 pounds, and that her cbg was 254 non-fasting.  She reported she had not checked it today.  Plan:  Patient will continue diabetic self-management efforts in regard to diet and monitoring of cbgs.           Patient will keep PCP appointment on March 9th.           RN will follow up in March.  [name] Milah Recht, RN, MSN RN [employer] [facility] [phone] Fax [phone]

## 2016-08-31 DIAGNOSIS — H5203 Hypermetropia, bilateral: Secondary | ICD-10-CM | POA: Diagnosis not present

## 2016-09-05 DIAGNOSIS — G4733 Obstructive sleep apnea (adult) (pediatric): Secondary | ICD-10-CM | POA: Diagnosis not present

## 2016-09-07 ENCOUNTER — Ambulatory Visit: Payer: Self-pay

## 2016-09-09 DIAGNOSIS — J019 Acute sinusitis, unspecified: Secondary | ICD-10-CM | POA: Diagnosis not present

## 2016-09-09 DIAGNOSIS — E785 Hyperlipidemia, unspecified: Secondary | ICD-10-CM | POA: Diagnosis not present

## 2016-09-09 DIAGNOSIS — E1165 Type 2 diabetes mellitus with hyperglycemia: Secondary | ICD-10-CM | POA: Diagnosis not present

## 2016-09-09 DIAGNOSIS — J309 Allergic rhinitis, unspecified: Secondary | ICD-10-CM | POA: Diagnosis not present

## 2016-09-09 DIAGNOSIS — R5383 Other fatigue: Secondary | ICD-10-CM | POA: Diagnosis not present

## 2016-09-09 DIAGNOSIS — Z7984 Long term (current) use of oral hypoglycemic drugs: Secondary | ICD-10-CM | POA: Diagnosis not present

## 2016-09-09 DIAGNOSIS — F419 Anxiety disorder, unspecified: Secondary | ICD-10-CM | POA: Diagnosis not present

## 2016-09-09 DIAGNOSIS — J3489 Other specified disorders of nose and nasal sinuses: Secondary | ICD-10-CM | POA: Diagnosis not present

## 2016-09-09 DIAGNOSIS — F339 Major depressive disorder, recurrent, unspecified: Secondary | ICD-10-CM | POA: Diagnosis not present

## 2016-09-14 ENCOUNTER — Other Ambulatory Visit: Payer: Self-pay

## 2016-09-14 NOTE — Patient Outreach (Signed)
[facility]) Care Management  09/14/2016  Taylor Patterson Nov 16, 1975 [id number]   Unsuccessful attempt to reach patient by phone.  HIPAA appropriate message left requesting call back. If no response RN will make another attempt within 10 days.  Taylor Deisonnie Chamberlain Steinborn, RN, MSN RN [employer] [phone] Fax [phone]

## 2016-09-21 ENCOUNTER — Ambulatory Visit: Payer: Self-pay

## 2016-09-22 ENCOUNTER — Other Ambulatory Visit: Payer: Self-pay

## 2016-09-22 NOTE — Patient Outreach (Signed)
[facility]) Care Management  09/22/2016  Taylor Patterson 08-25-1975 [id number]   Unsuccessful attempt to reach patient.  HIPAA appropriate message left with call back information. If no response RN will make another attempt within 10 days.  [name] [name], RN, MSN RN [employer] [facility] [phone] Fax [phone]

## 2016-09-26 ENCOUNTER — Other Ambulatory Visit: Payer: Self-pay | Admitting: Neurology

## 2016-09-28 ENCOUNTER — Other Ambulatory Visit: Payer: Self-pay

## 2016-09-28 VITALS — Wt 230.0 lb

## 2016-09-28 DIAGNOSIS — E1110 Type 2 diabetes mellitus with ketoacidosis without coma: Secondary | ICD-10-CM

## 2016-09-28 NOTE — Patient Outreach (Addendum)
[facility] ([facility]) Care Management  09/28/2016  Taylor SermonsChristina Stowe Patterson [birth year] [id number]   Telephone assessment with patient.  She reports she is recovering from the flu and sinusitis.  She states on her recent visit to Dr. Zachery DauerBarnes (PCP), her A1C was 13, but that her cbgs have been averaging around 120.  She also reports she now has a scale, and that her weight is down to 230 pounds. (Most recent documented weight was 265 pounds at the ED.)  Plan:  Patient will continue daily cbg monitoring.           Patient will continue efforts to adher to diabetic dietary guidelines and weight loss efforts.           RN will mail EMMI materials on diet guidelines.           RN will follow up in April.  [name] Higinio Grow, RN, MSN RN [employer] [facility] [phone] Fax [phone]

## 2016-09-28 NOTE — Patient Outreach (Signed)
[facility]) Care Management  09/28/2016  Taylor Patterson Apr 13, 1976 [id number]  Note:  EMR results for A1C on 08-12-16 was 10.9, in contrast to patient's self report of 13.  [name] Leona Alen, RN, MSN RN [employer] [phone] Fax [phone]

## 2016-10-06 DIAGNOSIS — G4733 Obstructive sleep apnea (adult) (pediatric): Secondary | ICD-10-CM | POA: Diagnosis not present

## 2016-10-14 DIAGNOSIS — E860 Dehydration: Secondary | ICD-10-CM | POA: Diagnosis not present

## 2016-10-14 DIAGNOSIS — E872 Acidosis: Secondary | ICD-10-CM | POA: Diagnosis not present

## 2016-10-14 DIAGNOSIS — E1165 Type 2 diabetes mellitus with hyperglycemia: Secondary | ICD-10-CM | POA: Diagnosis not present

## 2016-10-14 DIAGNOSIS — R739 Hyperglycemia, unspecified: Secondary | ICD-10-CM | POA: Diagnosis not present

## 2016-10-14 DIAGNOSIS — R110 Nausea: Secondary | ICD-10-CM | POA: Diagnosis not present

## 2016-10-14 DIAGNOSIS — R251 Tremor, unspecified: Secondary | ICD-10-CM | POA: Diagnosis not present

## 2016-10-14 DIAGNOSIS — N179 Acute kidney failure, unspecified: Secondary | ICD-10-CM | POA: Diagnosis not present

## 2016-10-14 DIAGNOSIS — A419 Sepsis, unspecified organism: Secondary | ICD-10-CM | POA: Diagnosis not present

## 2016-10-14 DIAGNOSIS — N390 Urinary tract infection, site not specified: Secondary | ICD-10-CM | POA: Diagnosis not present

## 2016-10-14 DIAGNOSIS — R112 Nausea with vomiting, unspecified: Secondary | ICD-10-CM | POA: Diagnosis not present

## 2016-10-14 DIAGNOSIS — E876 Hypokalemia: Secondary | ICD-10-CM | POA: Diagnosis not present

## 2016-10-14 DIAGNOSIS — Z7984 Long term (current) use of oral hypoglycemic drugs: Secondary | ICD-10-CM | POA: Diagnosis not present

## 2016-10-19 DIAGNOSIS — N12 Tubulo-interstitial nephritis, not specified as acute or chronic: Secondary | ICD-10-CM | POA: Diagnosis not present

## 2016-10-19 DIAGNOSIS — E1165 Type 2 diabetes mellitus with hyperglycemia: Secondary | ICD-10-CM | POA: Diagnosis not present

## 2016-10-19 DIAGNOSIS — R609 Edema, unspecified: Secondary | ICD-10-CM | POA: Diagnosis not present

## 2016-10-19 DIAGNOSIS — Z794 Long term (current) use of insulin: Secondary | ICD-10-CM | POA: Diagnosis not present

## 2016-10-19 DIAGNOSIS — Z7984 Long term (current) use of oral hypoglycemic drugs: Secondary | ICD-10-CM | POA: Diagnosis not present

## 2016-10-24 ENCOUNTER — Other Ambulatory Visit: Payer: Self-pay | Admitting: Neurology

## 2016-10-25 ENCOUNTER — Other Ambulatory Visit: Payer: Self-pay

## 2016-10-25 DIAGNOSIS — Z794 Long term (current) use of insulin: Secondary | ICD-10-CM | POA: Diagnosis not present

## 2016-10-25 DIAGNOSIS — E1165 Type 2 diabetes mellitus with hyperglycemia: Secondary | ICD-10-CM | POA: Diagnosis not present

## 2016-10-25 DIAGNOSIS — Z7984 Long term (current) use of oral hypoglycemic drugs: Secondary | ICD-10-CM | POA: Diagnosis not present

## 2016-10-25 DIAGNOSIS — Z79899 Other long term (current) drug therapy: Secondary | ICD-10-CM | POA: Diagnosis not present

## 2016-10-25 DIAGNOSIS — R609 Edema, unspecified: Secondary | ICD-10-CM | POA: Diagnosis not present

## 2016-10-25 NOTE — Patient Outreach (Signed)
[facility]) Care Management  10/25/2016  Taylor Patterson 1976-06-24 [id number]   Unsuccessful attempt to reach patient.  HIPAA appropriate message left requesting call back. If no response RN will make another attempt within 10 days.  Tyler Deis, RN, MSN RN [employer] [phone] Fax [phone]

## 2016-10-26 ENCOUNTER — Other Ambulatory Visit: Payer: Self-pay

## 2016-10-26 VITALS — Wt 255.0 lb

## 2016-10-26 DIAGNOSIS — E1110 Type 2 diabetes mellitus with ketoacidosis without coma: Secondary | ICD-10-CM

## 2016-10-26 NOTE — Patient Outreach (Signed)
[facility]) Care Management  10/26/2016  Taylor Patterson [birth year] [id number]   Telephone assessment completed.  Patient reports she went to [place], [place]. To visit family, and wound up being admitted to the hospital there on 10-14-16 with a UTI and sepsis.  She was discharged on 10-18-16.  Patient reports she went to follow up visit with her PCP yesterday.    Patient is now on Levemir 10 Units at hs, and her metformin was discontinued.  She reports her fasting cbgs have been in the 90s, and that non-fasting cbgs have been 130-140.  She is checking daily and keeping a record. She reports her weight is down to 255 pounds.  She reports she now has an [employer]" which is helping with her diet.  [facility] Transition of Care services explained and offered to patient.  She declined to accept TOC, but agreed to weekly calls from me for a few weeks to monitor her post-hospitalization.  Plan: Patient will monitor cbgs and keep a record.          Patient will contact PCP with any problems or questions.          RN will follow up with patient next week.  Tyler Deis, RN, MSN RN [employer] [phone] Fax [phone]

## 2016-11-01 ENCOUNTER — Other Ambulatory Visit: Payer: Self-pay

## 2016-11-01 NOTE — Patient Outreach (Signed)
[facility]) Care Management  11/01/2016  Taylor Patterson 04-24-1976 [id number]  Unsuccessful attempt to reach patient by phone.  HIPAA appropriate message with call back information left. If no response RN will make another attempt within 10 days.  Tyler Deis, RN, MSN RN [employer] [phone] Fax [phone]

## 2016-11-02 ENCOUNTER — Other Ambulatory Visit: Payer: Self-pay

## 2016-11-02 DIAGNOSIS — E1110 Type 2 diabetes mellitus with ketoacidosis without coma: Secondary | ICD-10-CM

## 2016-11-02 NOTE — Patient Outreach (Signed)
[facility]) Care Management  11/02/2016  Taylor Patterson 1975-08-18 [id number]   Short follow up with patient this week since she declined to be referred to [facility] after a recent hospitalization for UTI. Patient reports no s/s recurrent UTI.  Patient reports one episode of hyperglycemia in the past week.  She was able to discuss s/s of hyperglycemia and to tell me she does know to drink water to help bring it down.  Patient reports she had not checked her cbg today.  Plan:  Patient committed to taking and recording cbg daily for the next two weeks so that we can evaluate/discuss at our next contact.           RN will follow up in 2 weeks.  Tyler Deis, RN, MSN RN [employer] [phone] Fax [phone]

## 2016-11-05 DIAGNOSIS — G4733 Obstructive sleep apnea (adult) (pediatric): Secondary | ICD-10-CM | POA: Diagnosis not present

## 2016-11-16 ENCOUNTER — Other Ambulatory Visit: Payer: Self-pay

## 2016-11-16 DIAGNOSIS — E1110 Type 2 diabetes mellitus with ketoacidosis without coma: Secondary | ICD-10-CM

## 2016-11-16 NOTE — Patient Outreach (Signed)
[facility]) Care Management  11/16/2016  Taylor SermonsChristina Stowe Patterson [birth year] [id number]   Telephone assessment with patient.  She reports she is feeling good, and that no signs or symptoms of UTI have recurred.  Patient states her fasting cbg this am was 102.  RN is unable to document weight loss- patient states she has not been weighing.  Encouraged patient to document daily weights.  RN will follow up in June.  [name] Nesa Distel, RN, MSN RN [employer] [facility] [phone] Fax [phone]

## 2016-11-22 DIAGNOSIS — J209 Acute bronchitis, unspecified: Secondary | ICD-10-CM | POA: Diagnosis not present

## 2016-11-23 ENCOUNTER — Encounter: Payer: Self-pay | Admitting: Internal Medicine

## 2016-11-24 DIAGNOSIS — H47233 Glaucomatous optic atrophy, bilateral: Secondary | ICD-10-CM | POA: Diagnosis not present

## 2016-11-24 DIAGNOSIS — H25813 Combined forms of age-related cataract, bilateral: Secondary | ICD-10-CM | POA: Diagnosis not present

## 2016-11-24 DIAGNOSIS — E119 Type 2 diabetes mellitus without complications: Secondary | ICD-10-CM | POA: Diagnosis not present

## 2016-12-06 DIAGNOSIS — G4733 Obstructive sleep apnea (adult) (pediatric): Secondary | ICD-10-CM | POA: Diagnosis not present

## 2016-12-21 ENCOUNTER — Other Ambulatory Visit: Payer: Self-pay

## 2016-12-21 NOTE — Patient Outreach (Signed)
[facility]) Care Management  12/21/2016  Patty SermonsChristina Stowe Goyal March 25, 1976 [id number]   Unsuccessful attempt to reach patient.  HIPAA appropriate message left with call back information. If no response RN will make another attempt within 10 days.  [name] Kacyn Souder, RN, MSN RN [employer] [phone] Fax [phone]

## 2016-12-28 DIAGNOSIS — Z1231 Encounter for screening mammogram for malignant neoplasm of breast: Secondary | ICD-10-CM | POA: Diagnosis not present

## 2016-12-28 DIAGNOSIS — I10 Essential (primary) hypertension: Secondary | ICD-10-CM | POA: Diagnosis not present

## 2016-12-28 DIAGNOSIS — K219 Gastro-esophageal reflux disease without esophagitis: Secondary | ICD-10-CM | POA: Diagnosis not present

## 2016-12-28 DIAGNOSIS — E1165 Type 2 diabetes mellitus with hyperglycemia: Secondary | ICD-10-CM | POA: Diagnosis not present

## 2016-12-28 DIAGNOSIS — E784 Other hyperlipidemia: Secondary | ICD-10-CM | POA: Diagnosis not present

## 2016-12-29 ENCOUNTER — Other Ambulatory Visit: Payer: Self-pay

## 2016-12-29 NOTE — Patient Outreach (Signed)
[facility]) Care Management  12/29/2016  Patty SermonsChristina Stowe Ambs [birth year] [id number]   Unsuccessful attempt to reach patient.  HIPAA appropriate message left with call back information.    If no response RN will make another attempt within the next 10 days.  [name] Helga Asbury, RN, MSN RN [employer] [facility] [phone] Fax [phone]

## 2017-01-05 ENCOUNTER — Other Ambulatory Visit: Payer: Self-pay

## 2017-01-05 NOTE — Patient Outreach (Signed)
[facility]) Care Management  01/05/2017  Taylor SermonsChristina Stowe Patterson 10-Dec-1975 [id number]   Unsuccessful attempt to reach patient.  HIPAA appropriate message left with contact information. RN will make another attempt within 10 days.  [name], RN, MSN RN [employer] [phone] Fax [phone]

## 2017-01-12 ENCOUNTER — Other Ambulatory Visit: Payer: Self-pay

## 2017-01-12 NOTE — Patient Outreach (Signed)
[facility]) Care Management  01/12/2017  Patty SermonsChristina Stowe Sindt 1975-12-18 [id number]   Unsuccessful attempt to reach patient after multiple tries.  HIPAA appropriate message left with call back informationl  Plan:  Send case closure letter and close case if no call back after 10 days.  [name] Zellie Jenning, RN, MSN RN [employer] [phone] Fax [phone]

## 2017-01-17 DIAGNOSIS — E1165 Type 2 diabetes mellitus with hyperglycemia: Secondary | ICD-10-CM | POA: Diagnosis not present

## 2017-01-17 DIAGNOSIS — Z111 Encounter for screening for respiratory tuberculosis: Secondary | ICD-10-CM | POA: Diagnosis not present

## 2017-01-30 DIAGNOSIS — E1165 Type 2 diabetes mellitus with hyperglycemia: Secondary | ICD-10-CM | POA: Diagnosis not present

## 2017-02-06 DIAGNOSIS — Z1231 Encounter for screening mammogram for malignant neoplasm of breast: Secondary | ICD-10-CM | POA: Diagnosis not present

## 2017-02-11 IMAGING — RF DG ESOPHAGUS
15 series · 15 of 15 positions shown · non-contrast
Comparison: None in PACs

CLINICAL DATA: Dysphagia, globus sensation, chronic
gastroesophageal reflux.

EXAM:
ESOPHOGRAM / BARIUM SWALLOW / BARIUM TABLET STUDY
TECHNIQUE: Combined double contrast and single contrast examination performed
using effervescent crystals, thick barium liquid, and thin barium
liquid. The patient was observed with fluoroscopy swallowing a 13 mm
barium sulphate tablet.
FLUOROSCOPY TIME:  Radiation Exposure Index (as provided by the
fluoroscopic device): 154 dGy
If the device does not provide the exposure index:
Fluoroscopy Time:  1 minutes, 32 seconds.
Number of Acquired Images:  15

[Series 1: run · 1 of 1 slices shown (1 of 15)]
[im 1/1]
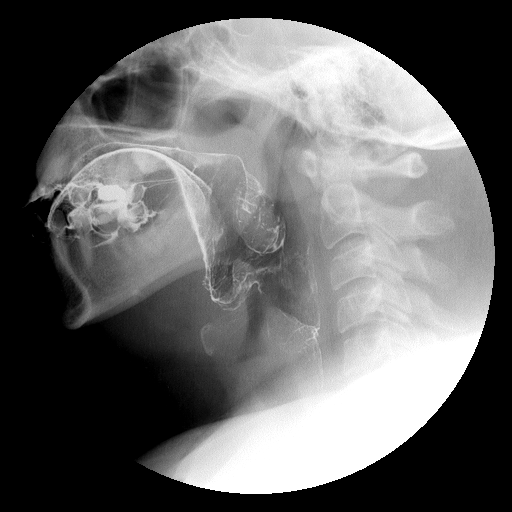

[Series 2: run · 1 of 1 slices shown (2 of 15)]
[im 1/1]
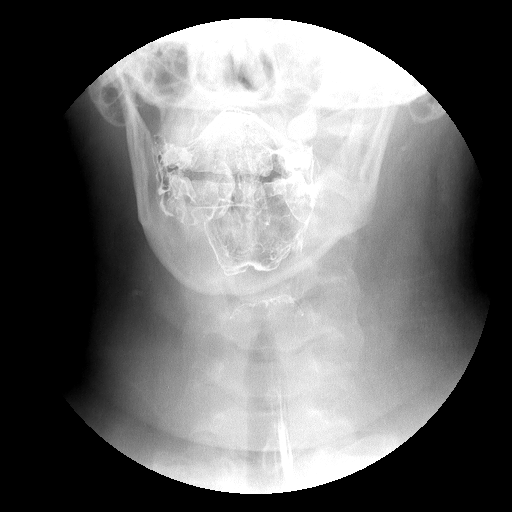

[Series 3: run · 1 of 1 slices shown (3 of 15)]
[im 1/1]
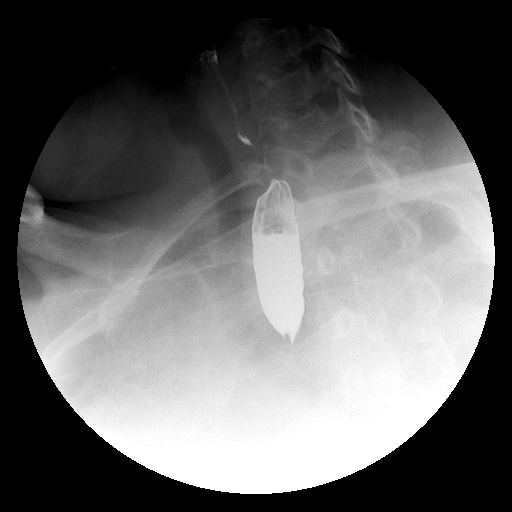

[Series 4: run · 1 of 1 slices shown (4 of 15)]
[im 1/1]
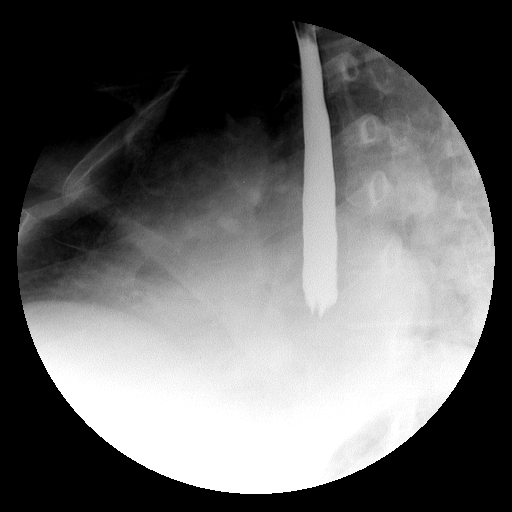

[Series 5: run · 1 of 1 slices shown (5 of 15)]
[im 1/1]
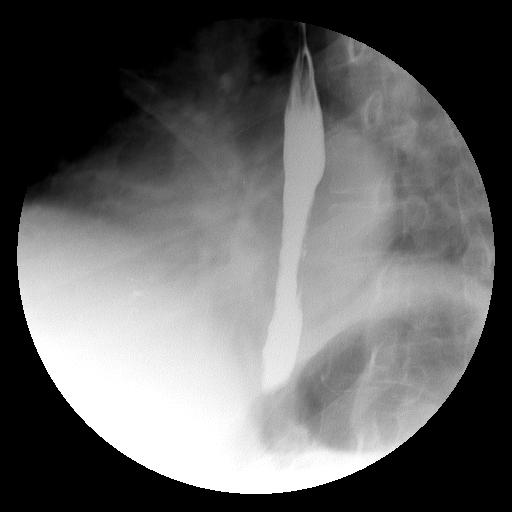

[Series 6: run · 1 of 1 slices shown (6 of 15)]
[im 1/1]
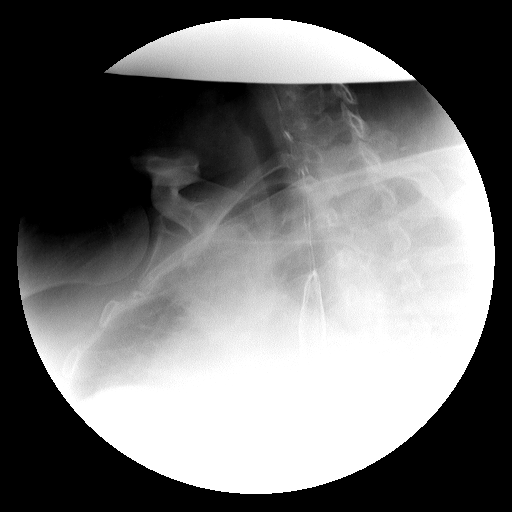

[Series 7: run · 1 of 1 slices shown (7 of 15)]
[im 1/1]
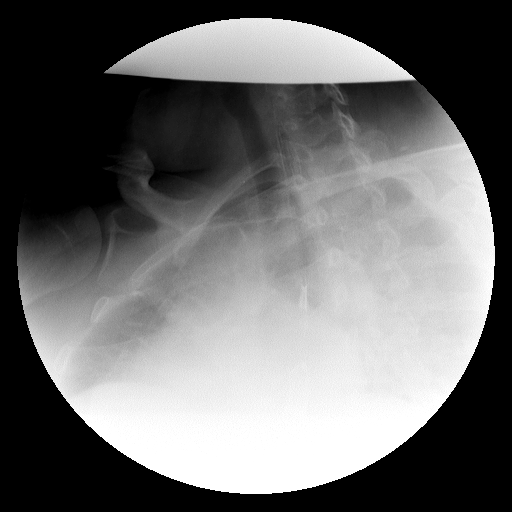

[Series 8: run · 1 of 1 slices shown (8 of 15)]
[im 1/1]
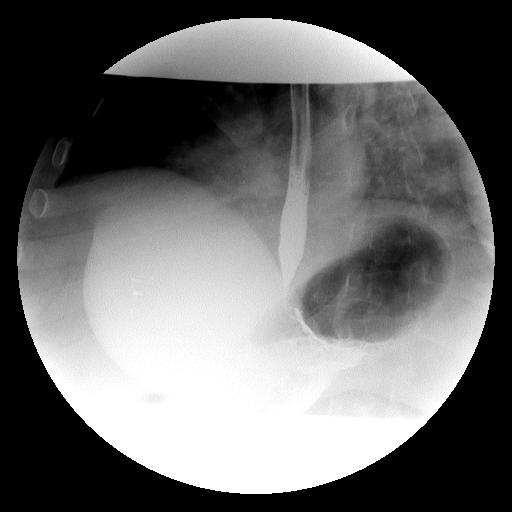

[Series 9: run · 1 of 1 slices shown (9 of 15)]
[im 1/1]
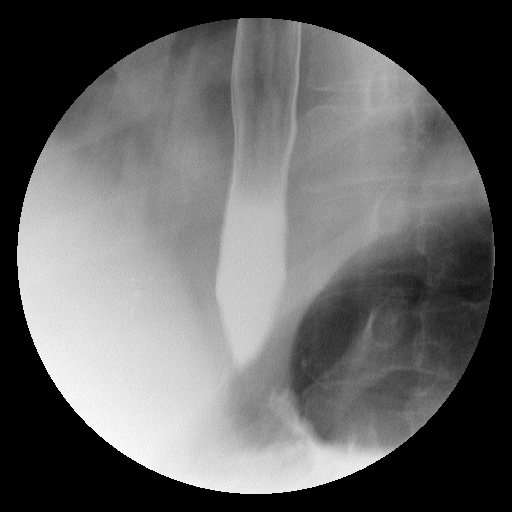

[Series 10: run · 1 of 1 slices shown (10 of 15)]
[im 1/1]
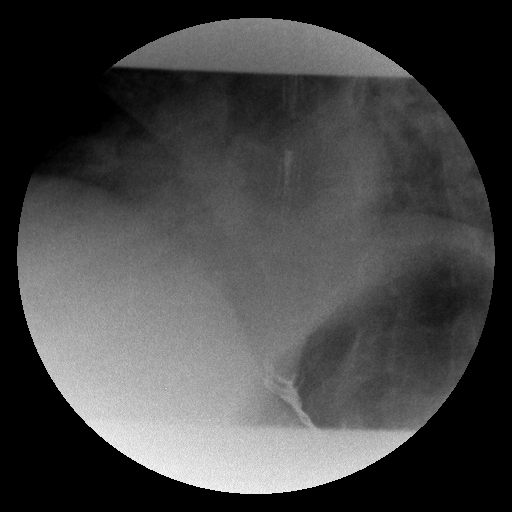

[Series 11: run · 1 of 1 slices shown (11 of 15)]
[im 1/1]
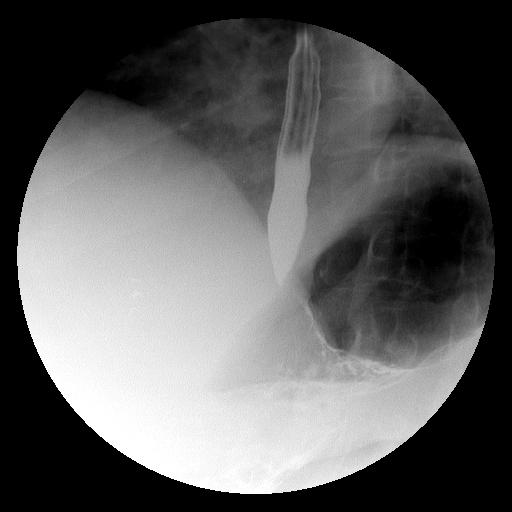

[Series 12: run · 1 of 1 slices shown (12 of 15)]
[im 1/1]
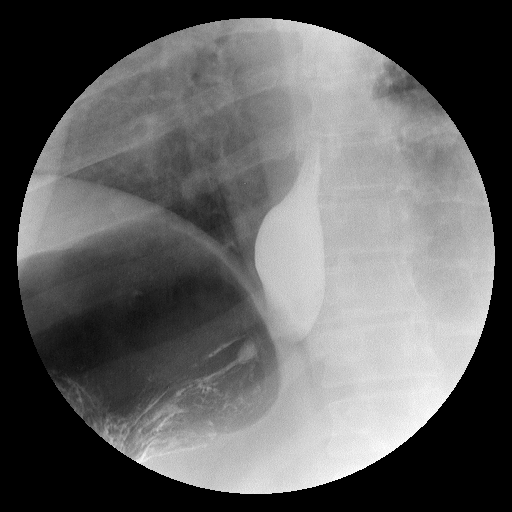

[Series 13: run · 1 of 1 slices shown (13 of 15)]
[im 1/1]
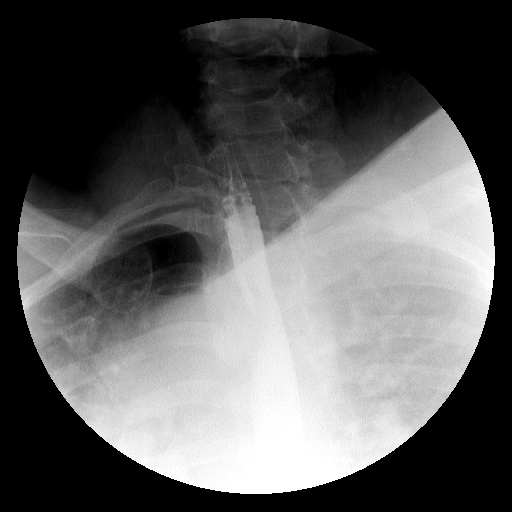

[Series 14: run · 1 of 1 slices shown (14 of 15)]
[im 1/1]
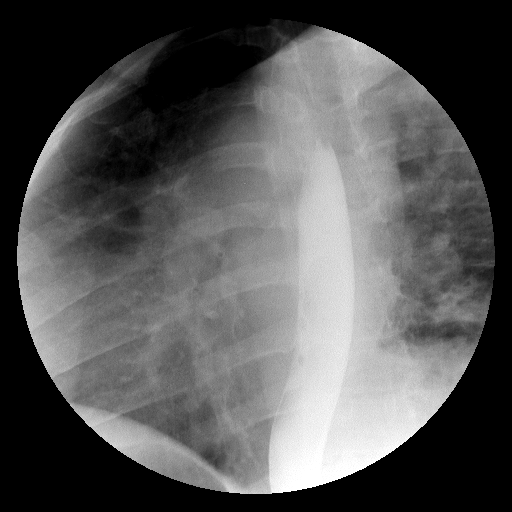

[Series 15: run · 1 of 1 slices shown (15 of 15)]
[im 1/1]
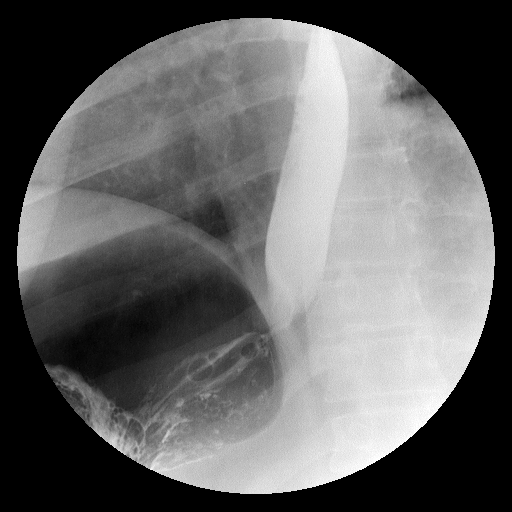

[15 of 15 positions shown; findings below may reference images not displayed]

FINDINGS: The patient ingested the thick and thin barium and the gas-forming
crystals without difficulty. The cervical esophagus distended well.
There was no laryngeal penetration of the barium. The thoracic
esophagus distended well. A tiny reducible hiatal hernia was
transiently observed. There was no evidence of a stricture nor of
the esophagitis. No tertiary contractions were observed. No
gastroesophageal reflux was observed. The barium tablet passed
promptly from the mouth to the stomach.
IMPRESSION: Essentially normal double contrast esophagram. A tiny reducible
hiatal hernia was transiently observed. There is no evidence of
reflux, stricture, or esophagitis.

## 2017-02-16 ENCOUNTER — Ambulatory Visit: Payer: Commercial Managed Care - HMO | Admitting: Adult Health

## 2017-02-16 DIAGNOSIS — R922 Inconclusive mammogram: Secondary | ICD-10-CM | POA: Diagnosis not present

## 2017-02-27 DIAGNOSIS — E1165 Type 2 diabetes mellitus with hyperglycemia: Secondary | ICD-10-CM | POA: Diagnosis not present

## 2017-02-27 DIAGNOSIS — J0100 Acute maxillary sinusitis, unspecified: Secondary | ICD-10-CM | POA: Diagnosis not present

## 2017-03-21 DIAGNOSIS — Z0000 Encounter for general adult medical examination without abnormal findings: Secondary | ICD-10-CM | POA: Diagnosis not present

## 2017-03-21 DIAGNOSIS — E1165 Type 2 diabetes mellitus with hyperglycemia: Secondary | ICD-10-CM | POA: Diagnosis not present

## 2017-04-12 DIAGNOSIS — E1165 Type 2 diabetes mellitus with hyperglycemia: Secondary | ICD-10-CM | POA: Diagnosis not present

## 2017-07-10 DIAGNOSIS — K219 Gastro-esophageal reflux disease without esophagitis: Secondary | ICD-10-CM | POA: Diagnosis not present

## 2017-07-10 DIAGNOSIS — I10 Essential (primary) hypertension: Secondary | ICD-10-CM | POA: Diagnosis not present

## 2017-07-10 DIAGNOSIS — E1165 Type 2 diabetes mellitus with hyperglycemia: Secondary | ICD-10-CM | POA: Diagnosis not present

## 2017-07-10 DIAGNOSIS — E7849 Other hyperlipidemia: Secondary | ICD-10-CM | POA: Diagnosis not present

## 2017-08-07 DIAGNOSIS — E1165 Type 2 diabetes mellitus with hyperglycemia: Secondary | ICD-10-CM | POA: Diagnosis not present

## 2017-08-29 DIAGNOSIS — K219 Gastro-esophageal reflux disease without esophagitis: Secondary | ICD-10-CM | POA: Diagnosis not present

## 2017-08-29 DIAGNOSIS — N920 Excessive and frequent menstruation with regular cycle: Secondary | ICD-10-CM | POA: Diagnosis not present

## 2017-08-29 DIAGNOSIS — N3946 Mixed incontinence: Secondary | ICD-10-CM | POA: Diagnosis not present

## 2017-08-29 DIAGNOSIS — Z124 Encounter for screening for malignant neoplasm of cervix: Secondary | ICD-10-CM | POA: Diagnosis not present

## 2017-08-29 DIAGNOSIS — N941 Unspecified dyspareunia: Secondary | ICD-10-CM | POA: Diagnosis not present

## 2017-08-29 DIAGNOSIS — R87610 Atypical squamous cells of undetermined significance on cytologic smear of cervix (ASC-US): Secondary | ICD-10-CM | POA: Diagnosis not present

## 2017-08-29 DIAGNOSIS — E1165 Type 2 diabetes mellitus with hyperglycemia: Secondary | ICD-10-CM | POA: Diagnosis not present

## 2017-08-29 DIAGNOSIS — Z01419 Encounter for gynecological examination (general) (routine) without abnormal findings: Secondary | ICD-10-CM | POA: Diagnosis not present

## 2017-08-29 DIAGNOSIS — N760 Acute vaginitis: Secondary | ICD-10-CM | POA: Diagnosis not present

## 2017-08-29 DIAGNOSIS — Z1231 Encounter for screening mammogram for malignant neoplasm of breast: Secondary | ICD-10-CM | POA: Diagnosis not present

## 2017-09-06 DIAGNOSIS — N920 Excessive and frequent menstruation with regular cycle: Secondary | ICD-10-CM | POA: Diagnosis not present

## 2017-10-04 DIAGNOSIS — N946 Dysmenorrhea, unspecified: Secondary | ICD-10-CM | POA: Diagnosis not present

## 2017-10-04 DIAGNOSIS — N898 Other specified noninflammatory disorders of vagina: Secondary | ICD-10-CM | POA: Diagnosis not present

## 2017-10-04 DIAGNOSIS — N909 Noninflammatory disorder of vulva and perineum, unspecified: Secondary | ICD-10-CM | POA: Diagnosis not present

## 2017-10-04 DIAGNOSIS — N9089 Other specified noninflammatory disorders of vulva and perineum: Secondary | ICD-10-CM | POA: Diagnosis not present

## 2017-10-04 DIAGNOSIS — B373 Candidiasis of vulva and vagina: Secondary | ICD-10-CM | POA: Diagnosis not present

## 2017-12-18 DIAGNOSIS — Z794 Long term (current) use of insulin: Secondary | ICD-10-CM | POA: Diagnosis not present

## 2017-12-18 DIAGNOSIS — I10 Essential (primary) hypertension: Secondary | ICD-10-CM | POA: Diagnosis not present

## 2017-12-18 DIAGNOSIS — E559 Vitamin D deficiency, unspecified: Secondary | ICD-10-CM | POA: Diagnosis not present

## 2017-12-18 DIAGNOSIS — E782 Mixed hyperlipidemia: Secondary | ICD-10-CM | POA: Diagnosis not present

## 2017-12-18 DIAGNOSIS — R21 Rash and other nonspecific skin eruption: Secondary | ICD-10-CM | POA: Diagnosis not present

## 2017-12-18 DIAGNOSIS — E1165 Type 2 diabetes mellitus with hyperglycemia: Secondary | ICD-10-CM | POA: Diagnosis not present

## 2018-01-17 DIAGNOSIS — R945 Abnormal results of liver function studies: Secondary | ICD-10-CM | POA: Diagnosis not present

## 2018-02-21 DIAGNOSIS — R1310 Dysphagia, unspecified: Secondary | ICD-10-CM | POA: Diagnosis not present

## 2018-02-21 DIAGNOSIS — K760 Fatty (change of) liver, not elsewhere classified: Secondary | ICD-10-CM | POA: Diagnosis not present

## 2018-02-21 DIAGNOSIS — R160 Hepatomegaly, not elsewhere classified: Secondary | ICD-10-CM | POA: Diagnosis not present

## 2018-02-21 DIAGNOSIS — K219 Gastro-esophageal reflux disease without esophagitis: Secondary | ICD-10-CM | POA: Diagnosis not present

## 2018-03-22 DIAGNOSIS — Z794 Long term (current) use of insulin: Secondary | ICD-10-CM | POA: Diagnosis not present

## 2018-03-22 DIAGNOSIS — K219 Gastro-esophageal reflux disease without esophagitis: Secondary | ICD-10-CM | POA: Diagnosis not present

## 2018-03-22 DIAGNOSIS — E559 Vitamin D deficiency, unspecified: Secondary | ICD-10-CM | POA: Diagnosis not present

## 2018-03-22 DIAGNOSIS — E782 Mixed hyperlipidemia: Secondary | ICD-10-CM | POA: Diagnosis not present

## 2018-03-22 DIAGNOSIS — E119 Type 2 diabetes mellitus without complications: Secondary | ICD-10-CM | POA: Diagnosis not present

## 2018-05-01 DIAGNOSIS — N898 Other specified noninflammatory disorders of vagina: Secondary | ICD-10-CM | POA: Diagnosis not present

## 2018-05-01 DIAGNOSIS — R300 Dysuria: Secondary | ICD-10-CM | POA: Diagnosis not present

## 2018-08-22 DIAGNOSIS — J069 Acute upper respiratory infection, unspecified: Secondary | ICD-10-CM | POA: Diagnosis not present

## 2018-09-20 DIAGNOSIS — E1165 Type 2 diabetes mellitus with hyperglycemia: Secondary | ICD-10-CM | POA: Diagnosis not present

## 2018-09-20 DIAGNOSIS — Z794 Long term (current) use of insulin: Secondary | ICD-10-CM | POA: Diagnosis not present

## 2018-09-20 DIAGNOSIS — E559 Vitamin D deficiency, unspecified: Secondary | ICD-10-CM | POA: Diagnosis not present

## 2018-09-20 DIAGNOSIS — I10 Essential (primary) hypertension: Secondary | ICD-10-CM | POA: Diagnosis not present

## 2018-09-20 DIAGNOSIS — E782 Mixed hyperlipidemia: Secondary | ICD-10-CM | POA: Diagnosis not present

## 2018-10-04 DIAGNOSIS — E559 Vitamin D deficiency, unspecified: Secondary | ICD-10-CM | POA: Diagnosis not present

## 2018-10-04 DIAGNOSIS — B379 Candidiasis, unspecified: Secondary | ICD-10-CM | POA: Diagnosis not present

## 2018-10-04 DIAGNOSIS — E1165 Type 2 diabetes mellitus with hyperglycemia: Secondary | ICD-10-CM | POA: Diagnosis not present

## 2018-12-07 DIAGNOSIS — K0889 Other specified disorders of teeth and supporting structures: Secondary | ICD-10-CM | POA: Diagnosis not present

## 2018-12-07 DIAGNOSIS — G501 Atypical facial pain: Secondary | ICD-10-CM | POA: Diagnosis not present

## 2018-12-18 DIAGNOSIS — E559 Vitamin D deficiency, unspecified: Secondary | ICD-10-CM | POA: Diagnosis not present

## 2018-12-18 DIAGNOSIS — E1165 Type 2 diabetes mellitus with hyperglycemia: Secondary | ICD-10-CM | POA: Diagnosis not present

## 2019-01-07 ENCOUNTER — Encounter (HOSPITAL_BASED_OUTPATIENT_CLINIC_OR_DEPARTMENT_OTHER): Payer: Medicare HMO | Attending: Internal Medicine

## 2019-01-07 DIAGNOSIS — L97212 Non-pressure chronic ulcer of right calf with fat layer exposed: Secondary | ICD-10-CM | POA: Diagnosis not present

## 2019-01-07 DIAGNOSIS — I10 Essential (primary) hypertension: Secondary | ICD-10-CM | POA: Diagnosis not present

## 2019-01-07 DIAGNOSIS — Z794 Long term (current) use of insulin: Secondary | ICD-10-CM | POA: Diagnosis not present

## 2019-01-07 DIAGNOSIS — G4733 Obstructive sleep apnea (adult) (pediatric): Secondary | ICD-10-CM | POA: Diagnosis not present

## 2019-01-07 DIAGNOSIS — E1151 Type 2 diabetes mellitus with diabetic peripheral angiopathy without gangrene: Secondary | ICD-10-CM | POA: Insufficient documentation

## 2019-01-07 DIAGNOSIS — L97211 Non-pressure chronic ulcer of right calf limited to breakdown of skin: Secondary | ICD-10-CM | POA: Diagnosis not present

## 2019-01-07 DIAGNOSIS — E11622 Type 2 diabetes mellitus with other skin ulcer: Secondary | ICD-10-CM | POA: Insufficient documentation

## 2019-01-07 DIAGNOSIS — I872 Venous insufficiency (chronic) (peripheral): Secondary | ICD-10-CM | POA: Diagnosis not present

## 2019-01-07 DIAGNOSIS — E1140 Type 2 diabetes mellitus with diabetic neuropathy, unspecified: Secondary | ICD-10-CM | POA: Diagnosis not present

## 2019-01-14 DIAGNOSIS — Z794 Long term (current) use of insulin: Secondary | ICD-10-CM | POA: Diagnosis not present

## 2019-01-14 DIAGNOSIS — E11622 Type 2 diabetes mellitus with other skin ulcer: Secondary | ICD-10-CM | POA: Diagnosis not present

## 2019-01-14 DIAGNOSIS — I872 Venous insufficiency (chronic) (peripheral): Secondary | ICD-10-CM | POA: Diagnosis not present

## 2019-01-14 DIAGNOSIS — E1151 Type 2 diabetes mellitus with diabetic peripheral angiopathy without gangrene: Secondary | ICD-10-CM | POA: Diagnosis not present

## 2019-01-14 DIAGNOSIS — I10 Essential (primary) hypertension: Secondary | ICD-10-CM | POA: Diagnosis not present

## 2019-01-14 DIAGNOSIS — G4733 Obstructive sleep apnea (adult) (pediatric): Secondary | ICD-10-CM | POA: Diagnosis not present

## 2019-01-14 DIAGNOSIS — E1140 Type 2 diabetes mellitus with diabetic neuropathy, unspecified: Secondary | ICD-10-CM | POA: Diagnosis not present

## 2019-01-14 DIAGNOSIS — L97212 Non-pressure chronic ulcer of right calf with fat layer exposed: Secondary | ICD-10-CM | POA: Diagnosis not present

## 2019-01-14 DIAGNOSIS — L97211 Non-pressure chronic ulcer of right calf limited to breakdown of skin: Secondary | ICD-10-CM | POA: Diagnosis not present

## 2019-01-21 DIAGNOSIS — L97212 Non-pressure chronic ulcer of right calf with fat layer exposed: Secondary | ICD-10-CM | POA: Diagnosis not present

## 2019-01-21 DIAGNOSIS — E11622 Type 2 diabetes mellitus with other skin ulcer: Secondary | ICD-10-CM | POA: Diagnosis not present

## 2019-01-21 DIAGNOSIS — I10 Essential (primary) hypertension: Secondary | ICD-10-CM | POA: Diagnosis not present

## 2019-01-21 DIAGNOSIS — Z794 Long term (current) use of insulin: Secondary | ICD-10-CM | POA: Diagnosis not present

## 2019-01-21 DIAGNOSIS — L97211 Non-pressure chronic ulcer of right calf limited to breakdown of skin: Secondary | ICD-10-CM | POA: Diagnosis not present

## 2019-01-21 DIAGNOSIS — G4733 Obstructive sleep apnea (adult) (pediatric): Secondary | ICD-10-CM | POA: Diagnosis not present

## 2019-01-21 DIAGNOSIS — I872 Venous insufficiency (chronic) (peripheral): Secondary | ICD-10-CM | POA: Diagnosis not present

## 2019-01-21 DIAGNOSIS — E1151 Type 2 diabetes mellitus with diabetic peripheral angiopathy without gangrene: Secondary | ICD-10-CM | POA: Diagnosis not present

## 2019-01-21 DIAGNOSIS — E1140 Type 2 diabetes mellitus with diabetic neuropathy, unspecified: Secondary | ICD-10-CM | POA: Diagnosis not present

## 2019-01-28 DIAGNOSIS — I10 Essential (primary) hypertension: Secondary | ICD-10-CM | POA: Diagnosis not present

## 2019-01-28 DIAGNOSIS — E1151 Type 2 diabetes mellitus with diabetic peripheral angiopathy without gangrene: Secondary | ICD-10-CM | POA: Diagnosis not present

## 2019-01-28 DIAGNOSIS — I872 Venous insufficiency (chronic) (peripheral): Secondary | ICD-10-CM | POA: Diagnosis not present

## 2019-01-28 DIAGNOSIS — L97211 Non-pressure chronic ulcer of right calf limited to breakdown of skin: Secondary | ICD-10-CM | POA: Diagnosis not present

## 2019-01-28 DIAGNOSIS — Z794 Long term (current) use of insulin: Secondary | ICD-10-CM | POA: Diagnosis not present

## 2019-01-28 DIAGNOSIS — L97212 Non-pressure chronic ulcer of right calf with fat layer exposed: Secondary | ICD-10-CM | POA: Diagnosis not present

## 2019-01-28 DIAGNOSIS — E11622 Type 2 diabetes mellitus with other skin ulcer: Secondary | ICD-10-CM | POA: Diagnosis not present

## 2019-01-28 DIAGNOSIS — E1140 Type 2 diabetes mellitus with diabetic neuropathy, unspecified: Secondary | ICD-10-CM | POA: Diagnosis not present

## 2019-01-28 DIAGNOSIS — G4733 Obstructive sleep apnea (adult) (pediatric): Secondary | ICD-10-CM | POA: Diagnosis not present

## 2019-02-04 ENCOUNTER — Other Ambulatory Visit: Payer: Self-pay

## 2019-02-04 ENCOUNTER — Encounter (HOSPITAL_BASED_OUTPATIENT_CLINIC_OR_DEPARTMENT_OTHER): Payer: Medicare HMO | Attending: Internal Medicine

## 2019-02-04 DIAGNOSIS — L97512 Non-pressure chronic ulcer of other part of right foot with fat layer exposed: Secondary | ICD-10-CM | POA: Diagnosis not present

## 2019-02-04 DIAGNOSIS — L97812 Non-pressure chronic ulcer of other part of right lower leg with fat layer exposed: Secondary | ICD-10-CM | POA: Insufficient documentation

## 2019-02-04 DIAGNOSIS — E11622 Type 2 diabetes mellitus with other skin ulcer: Secondary | ICD-10-CM | POA: Diagnosis not present

## 2019-02-04 DIAGNOSIS — L97212 Non-pressure chronic ulcer of right calf with fat layer exposed: Secondary | ICD-10-CM | POA: Diagnosis not present

## 2019-02-04 DIAGNOSIS — E1140 Type 2 diabetes mellitus with diabetic neuropathy, unspecified: Secondary | ICD-10-CM | POA: Insufficient documentation

## 2019-02-04 DIAGNOSIS — I872 Venous insufficiency (chronic) (peripheral): Secondary | ICD-10-CM | POA: Diagnosis not present

## 2019-02-10 DIAGNOSIS — E1110 Type 2 diabetes mellitus with ketoacidosis without coma: Secondary | ICD-10-CM | POA: Diagnosis not present

## 2019-02-10 DIAGNOSIS — R569 Unspecified convulsions: Secondary | ICD-10-CM | POA: Diagnosis not present

## 2019-02-10 DIAGNOSIS — G9341 Metabolic encephalopathy: Secondary | ICD-10-CM | POA: Diagnosis not present

## 2019-02-10 DIAGNOSIS — E785 Hyperlipidemia, unspecified: Secondary | ICD-10-CM | POA: Diagnosis not present

## 2019-02-10 DIAGNOSIS — E1010 Type 1 diabetes mellitus with ketoacidosis without coma: Secondary | ICD-10-CM | POA: Diagnosis not present

## 2019-02-10 DIAGNOSIS — I639 Cerebral infarction, unspecified: Secondary | ICD-10-CM | POA: Diagnosis not present

## 2019-02-10 DIAGNOSIS — A419 Sepsis, unspecified organism: Secondary | ICD-10-CM | POA: Diagnosis not present

## 2019-02-10 DIAGNOSIS — R0689 Other abnormalities of breathing: Secondary | ICD-10-CM | POA: Diagnosis not present

## 2019-02-10 DIAGNOSIS — L03115 Cellulitis of right lower limb: Secondary | ICD-10-CM | POA: Diagnosis not present

## 2019-02-10 DIAGNOSIS — G40909 Epilepsy, unspecified, not intractable, without status epilepticus: Secondary | ICD-10-CM | POA: Diagnosis not present

## 2019-02-10 DIAGNOSIS — G4089 Other seizures: Secondary | ICD-10-CM | POA: Diagnosis not present

## 2019-02-10 DIAGNOSIS — R4182 Altered mental status, unspecified: Secondary | ICD-10-CM | POA: Diagnosis not present

## 2019-02-10 DIAGNOSIS — F419 Anxiety disorder, unspecified: Secondary | ICD-10-CM | POA: Diagnosis not present

## 2019-02-10 DIAGNOSIS — N179 Acute kidney failure, unspecified: Secondary | ICD-10-CM | POA: Diagnosis not present

## 2019-02-10 DIAGNOSIS — Z03818 Encounter for observation for suspected exposure to other biological agents ruled out: Secondary | ICD-10-CM | POA: Diagnosis not present

## 2019-02-22 DIAGNOSIS — E1140 Type 2 diabetes mellitus with diabetic neuropathy, unspecified: Secondary | ICD-10-CM | POA: Diagnosis not present

## 2019-02-22 DIAGNOSIS — E11621 Type 2 diabetes mellitus with foot ulcer: Secondary | ICD-10-CM | POA: Diagnosis not present

## 2019-02-22 DIAGNOSIS — E11622 Type 2 diabetes mellitus with other skin ulcer: Secondary | ICD-10-CM | POA: Diagnosis not present

## 2019-02-22 DIAGNOSIS — I872 Venous insufficiency (chronic) (peripheral): Secondary | ICD-10-CM | POA: Diagnosis not present

## 2019-02-22 DIAGNOSIS — L03115 Cellulitis of right lower limb: Secondary | ICD-10-CM | POA: Diagnosis not present

## 2019-02-22 DIAGNOSIS — L97212 Non-pressure chronic ulcer of right calf with fat layer exposed: Secondary | ICD-10-CM | POA: Diagnosis not present

## 2019-02-22 DIAGNOSIS — L97512 Non-pressure chronic ulcer of other part of right foot with fat layer exposed: Secondary | ICD-10-CM | POA: Diagnosis not present

## 2019-02-22 DIAGNOSIS — L97812 Non-pressure chronic ulcer of other part of right lower leg with fat layer exposed: Secondary | ICD-10-CM | POA: Diagnosis not present

## 2019-02-26 ENCOUNTER — Other Ambulatory Visit (HOSPITAL_COMMUNITY): Payer: Self-pay | Admitting: Internal Medicine

## 2019-02-26 DIAGNOSIS — I739 Peripheral vascular disease, unspecified: Secondary | ICD-10-CM

## 2019-02-27 ENCOUNTER — Encounter (HOSPITAL_COMMUNITY): Payer: Self-pay

## 2019-03-01 ENCOUNTER — Ambulatory Visit (HOSPITAL_COMMUNITY)
Admission: RE | Admit: 2019-03-01 | Discharge: 2019-03-01 | Disposition: A | Discharge status: Home / Self Care | Payer: Medicare HMO | Source: Ambulatory Visit | Attending: Cardiovascular Disease | Admitting: Cardiovascular Disease

## 2019-03-01 ENCOUNTER — Other Ambulatory Visit: Payer: Self-pay

## 2019-03-01 DIAGNOSIS — I872 Venous insufficiency (chronic) (peripheral): Secondary | ICD-10-CM | POA: Diagnosis not present

## 2019-03-01 DIAGNOSIS — L97812 Non-pressure chronic ulcer of other part of right lower leg with fat layer exposed: Secondary | ICD-10-CM | POA: Diagnosis not present

## 2019-03-01 DIAGNOSIS — I739 Peripheral vascular disease, unspecified: Secondary | ICD-10-CM

## 2019-03-01 DIAGNOSIS — E1140 Type 2 diabetes mellitus with diabetic neuropathy, unspecified: Secondary | ICD-10-CM | POA: Diagnosis not present

## 2019-03-01 DIAGNOSIS — L97512 Non-pressure chronic ulcer of other part of right foot with fat layer exposed: Secondary | ICD-10-CM | POA: Diagnosis not present

## 2019-03-01 DIAGNOSIS — L97212 Non-pressure chronic ulcer of right calf with fat layer exposed: Secondary | ICD-10-CM | POA: Diagnosis not present

## 2019-03-01 DIAGNOSIS — E11622 Type 2 diabetes mellitus with other skin ulcer: Secondary | ICD-10-CM | POA: Diagnosis not present

## 2019-03-01 DIAGNOSIS — E11621 Type 2 diabetes mellitus with foot ulcer: Secondary | ICD-10-CM | POA: Diagnosis not present

## 2019-03-07 DIAGNOSIS — M79662 Pain in left lower leg: Secondary | ICD-10-CM | POA: Diagnosis not present

## 2019-03-07 DIAGNOSIS — E1165 Type 2 diabetes mellitus with hyperglycemia: Secondary | ICD-10-CM | POA: Diagnosis not present

## 2019-03-07 DIAGNOSIS — L03116 Cellulitis of left lower limb: Secondary | ICD-10-CM | POA: Diagnosis not present

## 2019-03-07 DIAGNOSIS — R2242 Localized swelling, mass and lump, left lower limb: Secondary | ICD-10-CM | POA: Diagnosis not present

## 2019-03-07 DIAGNOSIS — R600 Localized edema: Secondary | ICD-10-CM | POA: Diagnosis not present

## 2019-03-07 DIAGNOSIS — Z794 Long term (current) use of insulin: Secondary | ICD-10-CM | POA: Diagnosis not present

## 2019-03-07 DIAGNOSIS — I10 Essential (primary) hypertension: Secondary | ICD-10-CM | POA: Diagnosis not present

## 2019-03-07 DIAGNOSIS — L03115 Cellulitis of right lower limb: Secondary | ICD-10-CM | POA: Diagnosis not present

## 2019-03-07 DIAGNOSIS — Z7982 Long term (current) use of aspirin: Secondary | ICD-10-CM | POA: Diagnosis not present

## 2019-03-07 DIAGNOSIS — A419 Sepsis, unspecified organism: Secondary | ICD-10-CM | POA: Diagnosis not present

## 2019-03-07 DIAGNOSIS — E119 Type 2 diabetes mellitus without complications: Secondary | ICD-10-CM | POA: Diagnosis not present

## 2019-03-07 DIAGNOSIS — E785 Hyperlipidemia, unspecified: Secondary | ICD-10-CM | POA: Diagnosis not present

## 2019-03-07 DIAGNOSIS — I82412 Acute embolism and thrombosis of left femoral vein: Secondary | ICD-10-CM | POA: Diagnosis not present

## 2019-03-07 DIAGNOSIS — Z09 Encounter for follow-up examination after completed treatment for conditions other than malignant neoplasm: Secondary | ICD-10-CM | POA: Diagnosis not present

## 2019-03-08 ENCOUNTER — Encounter (HOSPITAL_BASED_OUTPATIENT_CLINIC_OR_DEPARTMENT_OTHER): Payer: Medicare HMO | Attending: Internal Medicine

## 2019-03-08 DIAGNOSIS — E1140 Type 2 diabetes mellitus with diabetic neuropathy, unspecified: Secondary | ICD-10-CM | POA: Insufficient documentation

## 2019-03-08 DIAGNOSIS — E119 Type 2 diabetes mellitus without complications: Secondary | ICD-10-CM | POA: Diagnosis not present

## 2019-03-08 DIAGNOSIS — Z7982 Long term (current) use of aspirin: Secondary | ICD-10-CM | POA: Diagnosis not present

## 2019-03-08 DIAGNOSIS — Z794 Long term (current) use of insulin: Secondary | ICD-10-CM | POA: Diagnosis not present

## 2019-03-08 DIAGNOSIS — L97812 Non-pressure chronic ulcer of other part of right lower leg with fat layer exposed: Secondary | ICD-10-CM | POA: Insufficient documentation

## 2019-03-08 DIAGNOSIS — I82432 Acute embolism and thrombosis of left popliteal vein: Secondary | ICD-10-CM | POA: Diagnosis not present

## 2019-03-08 DIAGNOSIS — M79605 Pain in left leg: Secondary | ICD-10-CM | POA: Diagnosis not present

## 2019-03-08 DIAGNOSIS — E11622 Type 2 diabetes mellitus with other skin ulcer: Secondary | ICD-10-CM | POA: Insufficient documentation

## 2019-03-08 DIAGNOSIS — I82412 Acute embolism and thrombosis of left femoral vein: Secondary | ICD-10-CM | POA: Diagnosis not present

## 2019-03-08 DIAGNOSIS — I82812 Embolism and thrombosis of superficial veins of left lower extremities: Secondary | ICD-10-CM | POA: Diagnosis not present

## 2019-03-08 DIAGNOSIS — I824Z2 Acute embolism and thrombosis of unspecified deep veins of left distal lower extremity: Secondary | ICD-10-CM | POA: Diagnosis not present

## 2019-03-08 DIAGNOSIS — E785 Hyperlipidemia, unspecified: Secondary | ICD-10-CM | POA: Diagnosis not present

## 2019-03-08 DIAGNOSIS — Z7901 Long term (current) use of anticoagulants: Secondary | ICD-10-CM | POA: Insufficient documentation

## 2019-03-08 DIAGNOSIS — Z7984 Long term (current) use of oral hypoglycemic drugs: Secondary | ICD-10-CM | POA: Insufficient documentation

## 2019-03-08 DIAGNOSIS — Z86718 Personal history of other venous thrombosis and embolism: Secondary | ICD-10-CM | POA: Insufficient documentation

## 2019-03-12 DIAGNOSIS — G40309 Generalized idiopathic epilepsy and epileptic syndromes, not intractable, without status epilepticus: Secondary | ICD-10-CM | POA: Diagnosis not present

## 2019-03-12 DIAGNOSIS — R9089 Other abnormal findings on diagnostic imaging of central nervous system: Secondary | ICD-10-CM | POA: Diagnosis not present

## 2019-03-12 DIAGNOSIS — M545 Low back pain: Secondary | ICD-10-CM | POA: Diagnosis not present

## 2019-03-12 DIAGNOSIS — R55 Syncope and collapse: Secondary | ICD-10-CM | POA: Diagnosis not present

## 2019-03-12 DIAGNOSIS — Z139 Encounter for screening, unspecified: Secondary | ICD-10-CM | POA: Diagnosis not present

## 2019-03-15 DIAGNOSIS — Z7901 Long term (current) use of anticoagulants: Secondary | ICD-10-CM | POA: Diagnosis not present

## 2019-03-15 DIAGNOSIS — Z7984 Long term (current) use of oral hypoglycemic drugs: Secondary | ICD-10-CM | POA: Diagnosis not present

## 2019-03-15 DIAGNOSIS — Z86718 Personal history of other venous thrombosis and embolism: Secondary | ICD-10-CM | POA: Diagnosis not present

## 2019-03-15 DIAGNOSIS — L97812 Non-pressure chronic ulcer of other part of right lower leg with fat layer exposed: Secondary | ICD-10-CM | POA: Diagnosis not present

## 2019-03-15 DIAGNOSIS — E11622 Type 2 diabetes mellitus with other skin ulcer: Secondary | ICD-10-CM | POA: Diagnosis not present

## 2019-03-15 DIAGNOSIS — E1140 Type 2 diabetes mellitus with diabetic neuropathy, unspecified: Secondary | ICD-10-CM | POA: Diagnosis not present

## 2019-03-22 DIAGNOSIS — L97212 Non-pressure chronic ulcer of right calf with fat layer exposed: Secondary | ICD-10-CM | POA: Diagnosis not present

## 2019-03-22 DIAGNOSIS — E11622 Type 2 diabetes mellitus with other skin ulcer: Secondary | ICD-10-CM | POA: Diagnosis not present

## 2019-03-22 DIAGNOSIS — Z86718 Personal history of other venous thrombosis and embolism: Secondary | ICD-10-CM | POA: Diagnosis not present

## 2019-03-22 DIAGNOSIS — I872 Venous insufficiency (chronic) (peripheral): Secondary | ICD-10-CM | POA: Diagnosis not present

## 2019-03-22 DIAGNOSIS — L97812 Non-pressure chronic ulcer of other part of right lower leg with fat layer exposed: Secondary | ICD-10-CM | POA: Diagnosis not present

## 2019-03-22 DIAGNOSIS — Z7984 Long term (current) use of oral hypoglycemic drugs: Secondary | ICD-10-CM | POA: Diagnosis not present

## 2019-03-22 DIAGNOSIS — E1140 Type 2 diabetes mellitus with diabetic neuropathy, unspecified: Secondary | ICD-10-CM | POA: Diagnosis not present

## 2019-03-22 DIAGNOSIS — Z7901 Long term (current) use of anticoagulants: Secondary | ICD-10-CM | POA: Diagnosis not present

## 2019-03-27 DIAGNOSIS — I83009 Varicose veins of unspecified lower extremity with ulcer of unspecified site: Secondary | ICD-10-CM | POA: Diagnosis not present

## 2019-03-27 DIAGNOSIS — I82402 Acute embolism and thrombosis of unspecified deep veins of left lower extremity: Secondary | ICD-10-CM | POA: Diagnosis not present

## 2019-03-29 DIAGNOSIS — L97212 Non-pressure chronic ulcer of right calf with fat layer exposed: Secondary | ICD-10-CM | POA: Diagnosis not present

## 2019-03-29 DIAGNOSIS — L97812 Non-pressure chronic ulcer of other part of right lower leg with fat layer exposed: Secondary | ICD-10-CM | POA: Diagnosis not present

## 2019-03-29 DIAGNOSIS — E1140 Type 2 diabetes mellitus with diabetic neuropathy, unspecified: Secondary | ICD-10-CM | POA: Diagnosis not present

## 2019-03-29 DIAGNOSIS — E11622 Type 2 diabetes mellitus with other skin ulcer: Secondary | ICD-10-CM | POA: Diagnosis not present

## 2019-03-29 DIAGNOSIS — Z7984 Long term (current) use of oral hypoglycemic drugs: Secondary | ICD-10-CM | POA: Diagnosis not present

## 2019-03-29 DIAGNOSIS — Z86718 Personal history of other venous thrombosis and embolism: Secondary | ICD-10-CM | POA: Diagnosis not present

## 2019-03-29 DIAGNOSIS — L299 Pruritus, unspecified: Secondary | ICD-10-CM | POA: Diagnosis not present

## 2019-03-29 DIAGNOSIS — R21 Rash and other nonspecific skin eruption: Secondary | ICD-10-CM | POA: Diagnosis not present

## 2019-03-29 DIAGNOSIS — Z7901 Long term (current) use of anticoagulants: Secondary | ICD-10-CM | POA: Diagnosis not present

## 2019-03-29 DIAGNOSIS — I872 Venous insufficiency (chronic) (peripheral): Secondary | ICD-10-CM | POA: Diagnosis not present

## 2019-04-01 DIAGNOSIS — Z760 Encounter for issue of repeat prescription: Secondary | ICD-10-CM | POA: Diagnosis not present

## 2019-04-01 DIAGNOSIS — I10 Essential (primary) hypertension: Secondary | ICD-10-CM | POA: Diagnosis not present

## 2019-04-01 DIAGNOSIS — E1165 Type 2 diabetes mellitus with hyperglycemia: Secondary | ICD-10-CM | POA: Diagnosis not present

## 2019-04-01 DIAGNOSIS — Z09 Encounter for follow-up examination after completed treatment for conditions other than malignant neoplasm: Secondary | ICD-10-CM | POA: Diagnosis not present

## 2019-04-01 DIAGNOSIS — I82402 Acute embolism and thrombosis of unspecified deep veins of left lower extremity: Secondary | ICD-10-CM | POA: Diagnosis not present

## 2019-04-05 ENCOUNTER — Encounter (HOSPITAL_BASED_OUTPATIENT_CLINIC_OR_DEPARTMENT_OTHER): Payer: Medicare HMO | Attending: Internal Medicine | Admitting: Internal Medicine

## 2019-04-05 ENCOUNTER — Other Ambulatory Visit: Payer: Self-pay

## 2019-04-05 DIAGNOSIS — M79661 Pain in right lower leg: Secondary | ICD-10-CM | POA: Diagnosis not present

## 2019-04-05 DIAGNOSIS — L84 Corns and callosities: Secondary | ICD-10-CM | POA: Diagnosis not present

## 2019-04-05 DIAGNOSIS — L97212 Non-pressure chronic ulcer of right calf with fat layer exposed: Secondary | ICD-10-CM | POA: Diagnosis not present

## 2019-04-05 DIAGNOSIS — I82512 Chronic embolism and thrombosis of left femoral vein: Secondary | ICD-10-CM | POA: Insufficient documentation

## 2019-04-05 DIAGNOSIS — E1140 Type 2 diabetes mellitus with diabetic neuropathy, unspecified: Secondary | ICD-10-CM | POA: Insufficient documentation

## 2019-04-05 DIAGNOSIS — E11622 Type 2 diabetes mellitus with other skin ulcer: Secondary | ICD-10-CM | POA: Diagnosis not present

## 2019-04-05 DIAGNOSIS — L97812 Non-pressure chronic ulcer of other part of right lower leg with fat layer exposed: Secondary | ICD-10-CM | POA: Diagnosis not present

## 2019-04-05 DIAGNOSIS — I872 Venous insufficiency (chronic) (peripheral): Secondary | ICD-10-CM | POA: Diagnosis not present

## 2019-04-05 NOTE — Progress Notes (Signed)
to get to looks less than vibrant. Electronic Signature(s) Signed: 04/05/2019 6:19:43 PM By: Linton Ham MD Entered By: Linton Ham on 04/05/2019 13:47:20 -------------------------------------------------------------------------------- Physician Orders Details Patient Name: Date of Service: Taylor Patterson 04/05/2019 1:00 PM Medical Record [id number] Patient Account Number:  [id number] Date of Birth/Sex: Treating RN: 14-Aug-1975 (43 y.o. 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 - Friday Dressing Change Frequency Wound #2 Right,Medial Lower Leg Do not change entire dressing for one week. Skin Barriers/Peri-Wound Care TCA Cream or Ointment - to left leg daily Wound #2 Right,Medial Lower Leg Moisturizing lotion - to leg Wound Cleansing Wound #2 Right,Medial Lower Leg May shower with protection. Primary Wound Dressing Wound #2 Right,Medial Lower Leg Endoform - moisten with saline Secondary Dressing Wound #2 Right,Medial Lower Leg Dry Gauze Edema Control Kerlix and Coban - Right Lower Extremity Avoid standing for long periods of time Elevate legs to the level of the heart or above for 30 minutes daily and/or when sitting, a frequency of: - throughout the day Exercise regularly Patient Medications Allergies: coconut, kiwi, bee venom (honey bee), doxycycline, latex, egg, Sulfa (Sulfonamide Antibiotics) Notifications Medication Indication Start End lidocaine prior to 04/05/2019 debridement DOSE topical 4 % cream - cream topical tramadol pain DOSE 1 - oral 50 mg tablet - 1 tablet oral every 6 hrs as needed for pain Electronic Signature(s) Signed: 04/05/2019 6:19:43 PM By: Linton Ham MD Signed: 04/05/2019 7:05:19 PM By: Baruch Gouty RN, [name] Entered By: Baruch Gouty on 04/05/2019 13:52:18 -------------------------------------------------------------------------------- Prescription  04/05/2019 Patient Name: Taylor Patterson. Provider: Linton Ham MD Date of Birth: [birth year] NPI#: [id number] Sex: F DEA#: [id number] Phone #: [phone] License #: [id number] Patient Address: [address] [place] [address] [place], [place] [address] [phone] Allergies coconut Reaction: hives Severity: Severe kiwi Reaction: esophageal swelling Severity: Severe bee venom (honey bee) Reaction: hives, sweling Severity: Severe doxycycline Reaction: swelling Severity: Severe latex Reaction: rash Severity: Moderate egg Reaction: throat swelling Sulfa (Sulfonamide Antibiotics) Reaction: unknown Medication Note: This prescription was automatically generated because the medication is a controlled substance, and cannot be prescribed electronically. Medication: Route: Strength: Form: tramadol oral 50 mg tablet Class: OPIOID ANALGESICS Dose: Frequency / Time: Indication: 1 1 tablet oral every 6 hrs as needed for pain pain Number of Refills: Number of Units: 1 Thirty (30) Tablet(s) Generic Substitution: Start Date: End Date: Administered at [facility]: No Note to Pharmacy: Signature(s): Date(s): Electronic Signature(s) Signed: 04/05/2019 6:19:43 PM By: Linton Ham MD Signed: 04/05/2019 7:05:19 PM By: Baruch Gouty RN, [name] Entered By: Baruch Gouty on 04/05/2019 13:52:18 --------------------------------------------------------------------------------  Problem List Details Patient Name: Date of Service: Taylor Patterson 04/05/2019 1:00 PM Medical Record [id number] Patient Account Number: [id number] Date of Birth/Sex: Treating RN: 29-Apr-1976 (43 y.o. Elam Dutch Primary Care Provider: Other Clinician: Leighton Ruff Referring Provider: Treating Provider/Extender:Chandan Fly, Gwyndolyn Saxon, [place] Siren in Treatment:  12 Active Problems ICD-10 Evaluated Encounter Code Description Active Date Today Diagnosis L97.211 Non-pressure chronic ulcer of right calf limited to 01/07/2019 No Yes breakdown of skin E11.9 Type 2 diabetes mellitus without  to get to looks less than vibrant. Electronic Signature(s) Signed: 04/05/2019 6:19:43 PM By: Linton Ham MD Entered By: Linton Ham on 04/05/2019 13:47:20 -------------------------------------------------------------------------------- Physician Orders Details Patient Name: Date of Service: Taylor Patterson 04/05/2019 1:00 PM Medical Record [id number] Patient Account Number:  [id number] Date of Birth/Sex: Treating RN: 14-Aug-1975 (43 y.o. 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 - Friday Dressing Change Frequency Wound #2 Right,Medial Lower Leg Do not change entire dressing for one week. Skin Barriers/Peri-Wound Care TCA Cream or Ointment - to left leg daily Wound #2 Right,Medial Lower Leg Moisturizing lotion - to leg Wound Cleansing Wound #2 Right,Medial Lower Leg May shower with protection. Primary Wound Dressing Wound #2 Right,Medial Lower Leg Endoform - moisten with saline Secondary Dressing Wound #2 Right,Medial Lower Leg Dry Gauze Edema Control Kerlix and Coban - Right Lower Extremity Avoid standing for long periods of time Elevate legs to the level of the heart or above for 30 minutes daily and/or when sitting, a frequency of: - throughout the day Exercise regularly Patient Medications Allergies: coconut, kiwi, bee venom (honey bee), doxycycline, latex, egg, Sulfa (Sulfonamide Antibiotics) Notifications Medication Indication Start End lidocaine prior to 04/05/2019 debridement DOSE topical 4 % cream - cream topical tramadol pain DOSE 1 - oral 50 mg tablet - 1 tablet oral every 6 hrs as needed for pain Electronic Signature(s) Signed: 04/05/2019 6:19:43 PM By: Linton Ham MD Signed: 04/05/2019 7:05:19 PM By: Baruch Gouty RN, [name] Entered By: Baruch Gouty on 04/05/2019 13:52:18 -------------------------------------------------------------------------------- Prescription  04/05/2019 Patient Name: Taylor Patterson. Provider: Linton Ham MD Date of Birth: [birth year] NPI#: [id number] Sex: F DEA#: [id number] Phone #: [phone] License #: [id number] Patient Address: [address] [place] [address] [place], [place] [address] [phone] Allergies coconut Reaction: hives Severity: Severe kiwi Reaction: esophageal swelling Severity: Severe bee venom (honey bee) Reaction: hives, sweling Severity: Severe doxycycline Reaction: swelling Severity: Severe latex Reaction: rash Severity: Moderate egg Reaction: throat swelling Sulfa (Sulfonamide Antibiotics) Reaction: unknown Medication Note: This prescription was automatically generated because the medication is a controlled substance, and cannot be prescribed electronically. Medication: Route: Strength: Form: tramadol oral 50 mg tablet Class: OPIOID ANALGESICS Dose: Frequency / Time: Indication: 1 1 tablet oral every 6 hrs as needed for pain pain Number of Refills: Number of Units: 1 Thirty (30) Tablet(s) Generic Substitution: Start Date: End Date: Administered at [facility]: No Note to Pharmacy: Signature(s): Date(s): Electronic Signature(s) Signed: 04/05/2019 6:19:43 PM By: Linton Ham MD Signed: 04/05/2019 7:05:19 PM By: Baruch Gouty RN, [name] Entered By: Baruch Gouty on 04/05/2019 13:52:18 --------------------------------------------------------------------------------  Problem List Details Patient Name: Date of Service: Taylor Patterson 04/05/2019 1:00 PM Medical Record [id number] Patient Account Number: [id number] Date of Birth/Sex: Treating RN: 29-Apr-1976 (43 y.o. Elam Dutch Primary Care Provider: Other Clinician: Leighton Ruff Referring Provider: Treating Provider/Extender:Chandan Fly, Gwyndolyn Saxon, [place] Siren in Treatment:  12 Active Problems ICD-10 Evaluated Encounter Code Description Active Date Today Diagnosis L97.211 Non-pressure chronic ulcer of right calf limited to 01/07/2019 No Yes breakdown of skin E11.9 Type 2 diabetes mellitus without  Material removed includes Subcutaneous Tissue, Slough, and Fibrin/Exudate after achieving pain control using Lidocaine 4% Topical Solution. No specimens were taken. A time out was conducted at 13:35, prior to the start of the procedure. A Minimum amount of bleeding was controlled with Pressure. The procedure was tolerated well with a pain level of 5 throughout and a pain level of 3 following the procedure. Post Debridement Measurements: 0.6cm length x 0.4cm width x 0.1cm depth; 0.019cm^3 volume. Character of Wound/Ulcer Post Debridement is improved.  Severity of Tissue Post Debridement is: Fat layer exposed. Post procedure Diagnosis Wound #2: Same as Pre-Procedure Plan Follow-up Appointments: Return Appointment in 1 week. - Friday Dressing Change Frequency: Wound #2 Right,Medial Lower Leg: Do not change entire dressing for one week. Skin Barriers/Peri-Wound Care: TCA Cream or Ointment - to left leg daily Wound #2 Right,Medial Lower Leg: Moisturizing lotion - to leg Wound Cleansing: Wound #2 Right,Medial Lower Leg: May shower with protection. Primary Wound Dressing: Wound #2 Right,Medial Lower Leg: Endoform - moisten with saline Secondary Dressing: Wound #2 Right,Medial Lower Leg: Dry Gauze Edema Control: Kerlix and Coban - Right Lower Extremity Avoid standing for long periods of time Elevate legs to the level of the heart or above for 30 minutes daily and/or when sitting, a frequency of: - throughout the day Exercise regularly The following medication(s) was prescribed: lidocaine topical 4 % cream cream topical for prior to debridement was prescribed at facility tramadol oral 50 mg tablet 1 1 tablet oral every 6 hrs as needed for pain for pain 1. She has reflux studies on October 8 2. We change the primary dressing today to endoform. 3. The rash on the left leg is gone presumably some form of contact dermatitis. Electronic Signature(s) Signed: 04/05/2019 6:19:43 PM By: Baltazar Najjar MD Signed: 04/05/2019 7:05:19 PM By: Zenaida Deed RN, [name] Entered By: Zenaida Deed on 04/05/2019 13:52:28 -------------------------------------------------------------------------------- SuperBill Details Patient Name: Date of Service: Taylor Patterson, Taylor Patterson 04/05/2019 Medical Record [id number] Patient Account Number: [id number] Date of Birth/Sex: Treating RN: 01/23/1976 (43 y.o. 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 Facility Procedures CPT4 Code Description: 95621308 11042 - DEB SUBQ TISSUE 20 SQ CM/< ICD-10 Diagnosis Description L97.211 Non-pressure chronic ulcer of right calf limited to breakd Modifier: own of skin Quantity: 1 Physician Procedures CPT4 Code Description: [id number] 11042 - WC PHYS SUBQ TISS 20 SQ CM ICD-10 Diagnosis Description L97.211 Non-pressure chronic ulcer of right calf limited to breakd Modifier: own of skin Quantity: 1 Electronic Signature(s) Signed: 04/05/2019 6:19:43 PM By: Baltazar Najjar MD Entered By: Baltazar Najjar on 04/05/2019 13:49:25  Material removed includes Subcutaneous Tissue, Slough, and Fibrin/Exudate after achieving pain control using Lidocaine 4% Topical Solution. No specimens were taken. A time out was conducted at 13:35, prior to the start of the procedure. A Minimum amount of bleeding was controlled with Pressure. The procedure was tolerated well with a pain level of 5 throughout and a pain level of 3 following the procedure. Post Debridement Measurements: 0.6cm length x 0.4cm width x 0.1cm depth; 0.019cm^3 volume. Character of Wound/Ulcer Post Debridement is improved.  Severity of Tissue Post Debridement is: Fat layer exposed. Post procedure Diagnosis Wound #2: Same as Pre-Procedure Plan Follow-up Appointments: Return Appointment in 1 week. - Friday Dressing Change Frequency: Wound #2 Right,Medial Lower Leg: Do not change entire dressing for one week. Skin Barriers/Peri-Wound Care: TCA Cream or Ointment - to left leg daily Wound #2 Right,Medial Lower Leg: Moisturizing lotion - to leg Wound Cleansing: Wound #2 Right,Medial Lower Leg: May shower with protection. Primary Wound Dressing: Wound #2 Right,Medial Lower Leg: Endoform - moisten with saline Secondary Dressing: Wound #2 Right,Medial Lower Leg: Dry Gauze Edema Control: Kerlix and Coban - Right Lower Extremity Avoid standing for long periods of time Elevate legs to the level of the heart or above for 30 minutes daily and/or when sitting, a frequency of: - throughout the day Exercise regularly The following medication(s) was prescribed: lidocaine topical 4 % cream cream topical for prior to debridement was prescribed at facility tramadol oral 50 mg tablet 1 1 tablet oral every 6 hrs as needed for pain for pain 1. She has reflux studies on October 8 2. We change the primary dressing today to endoform. 3. The rash on the left leg is gone presumably some form of contact dermatitis. Electronic Signature(s) Signed: 04/05/2019 6:19:43 PM By: Baltazar Najjar MD Signed: 04/05/2019 7:05:19 PM By: Zenaida Deed RN, [name] Entered By: Zenaida Deed on 04/05/2019 13:52:28 -------------------------------------------------------------------------------- SuperBill Details Patient Name: Date of Service: Taylor Patterson, Taylor Patterson 04/05/2019 Medical Record [id number] Patient Account Number: [id number] Date of Birth/Sex: Treating RN: 01/23/1976 (43 y.o. 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 Facility Procedures CPT4 Code Description: 95621308 11042 - DEB SUBQ TISSUE 20 SQ CM/< ICD-10 Diagnosis Description L97.211 Non-pressure chronic ulcer of right calf limited to breakd Modifier: own of skin Quantity: 1 Physician Procedures CPT4 Code Description: [id number] 11042 - WC PHYS SUBQ TISS 20 SQ CM ICD-10 Diagnosis Description L97.211 Non-pressure chronic ulcer of right calf limited to breakd Modifier: own of skin Quantity: 1 Electronic Signature(s) Signed: 04/05/2019 6:19:43 PM By: Baltazar Najjar MD Entered By: Baltazar Najjar on 04/05/2019 13:49:25  Taylor Patterson, Taylor Patterson ([id number]) Visit Report for 04/05/2019 Debridement Details Patient Name: Date of Service: Taylor Patterson, Taylor Patterson 04/05/2019 1:00 PM Medical Record [id number] Patient Account Number: [id number] Date of Birth/Sex: Treating RN: 05/02/1976 (43 y.o. Tommye Standard Primary Care Provider: Juluis Rainier Other Clinician: Referring Provider: Treating Provider/Extender:Rennie Hack, Wardell Heath, Rhys Martini in Treatment: 12 Debridement Performed for Wound #2 Right,Medial Lower Leg Assessment: Performed By: Physician Maxwell Caul., MD Debridement Type: Debridement Severity of Tissue Pre Fat layer exposed Debridement: Level of Consciousness (Pre- Awake and Alert procedure): Pre-procedure Verification/Time Out Taken: Yes - 13:35 Start Time: 13:35 Pain Control: Lidocaine 4% Topical Solution Total Area Debrided (L x W): 0.6 (cm) x 0.4 (cm) = 0.24 (cm) Tissue and other material Viable, Non-Viable, Slough, Subcutaneous, Fibrin/Exudate, Slough debrided: Level: Skin/Subcutaneous Tissue Debridement Description: Excisional Instrument: Curette Bleeding: Minimum Hemostasis Achieved: Pressure End Time: 13:37 Procedural Pain: 5 Post Procedural Pain: 3 Response to Treatment: Procedure was tolerated well Level of Consciousness Awake and Alert (Post-procedure): Post Debridement Measurements of Total Wound Length: (cm) 0.6 Width: (cm) 0.4 Depth: (cm) 0.1 Volume: (cm) 0.019 Character of Wound/Ulcer Post Improved Debridement: Severity of Tissue Post Debridement: Fat layer exposed Post Procedure Diagnosis Same as Pre-procedure Electronic Signature(s) Signed: 04/05/2019 6:19:43 PM By: Baltazar Najjar MD Signed: 04/05/2019 7:05:19 PM By: Zenaida Deed RN, [name] Entered By: Baltazar Najjar on 04/05/2019 13:44:21 -------------------------------------------------------------------------------- HPI Details Patient Name: Date of Service: Taylor Patterson  04/05/2019 1:00 PM Medical Record [id number] Patient Account Number: [id number] Date of Birth/Sex: Treating RN: 1976-05-28 (43 y.o. Tommye Standard Primary Care Provider: Juluis Rainier Other Clinician: Referring Provider: Treating Provider/Extender:Noemie Devivo, Wardell Heath, Rhys Martini in Treatment: 12 History of Present Illness Location: right leg Severity: venous ulcer Timing: 7 months HPI Description: this is a patient from [facility]. She has been followed by [facility] for a chronic wound on her right anterior lateral leg related to venous insufficiency. She tells [place] she has had extensive treatment for this over the years up to and including a single apligraf and thera skin. Nevertheless this is not been healing. It has gotten smaller but it apparently has been in the current condition for the last 4-5 months. We do not have arterial studies although she has had venous studies done in September 2015. At that point she had superficial thrombosis of the right thigh greater saphenous vein. I believe she had some form of ablative procedure done. She is now in the [place] area. She was seen at [facility] last month with a blood sugar over 500.she had GU candidiasis and hyperglycemia. She was not put on insulin she remains on metformin. 09/25/15 the patient returns today with a wound that actually paradoxically looks quite healthy. There is extensive surrounding hemosiderin likely from clonic of venous insufficiency. The issue we spent some time on is that her hemoglobin A1c is 15.6 her blood sugar today is 510. She has a appointment next week with her primary physician I think at [facility] on [address]. I'm going to have our staff phone up to see if they can get her in this afternoon to start insulin 10/09/14; the patient returns today of the wound actually looks fairly healthy but the dimensions are not much different.there is no  surrounding infection but there is some drainage. I had some trouble feeling her peripheral pulses. Her ABIs done in this clinic is greater than 1.3. she has severe surrounding venous insufficiency changes with hemosiderin deposits. The edema is well controlled  to get to looks less than vibrant. Electronic Signature(s) Signed: 04/05/2019 6:19:43 PM By: Linton Ham MD Entered By: Linton Ham on 04/05/2019 13:47:20 -------------------------------------------------------------------------------- Physician Orders Details Patient Name: Date of Service: Taylor Patterson 04/05/2019 1:00 PM Medical Record [id number] Patient Account Number:  [id number] Date of Birth/Sex: Treating RN: 14-Aug-1975 (43 y.o. 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 - Friday Dressing Change Frequency Wound #2 Right,Medial Lower Leg Do not change entire dressing for one week. Skin Barriers/Peri-Wound Care TCA Cream or Ointment - to left leg daily Wound #2 Right,Medial Lower Leg Moisturizing lotion - to leg Wound Cleansing Wound #2 Right,Medial Lower Leg May shower with protection. Primary Wound Dressing Wound #2 Right,Medial Lower Leg Endoform - moisten with saline Secondary Dressing Wound #2 Right,Medial Lower Leg Dry Gauze Edema Control Kerlix and Coban - Right Lower Extremity Avoid standing for long periods of time Elevate legs to the level of the heart or above for 30 minutes daily and/or when sitting, a frequency of: - throughout the day Exercise regularly Patient Medications Allergies: coconut, kiwi, bee venom (honey bee), doxycycline, latex, egg, Sulfa (Sulfonamide Antibiotics) Notifications Medication Indication Start End lidocaine prior to 04/05/2019 debridement DOSE topical 4 % cream - cream topical tramadol pain DOSE 1 - oral 50 mg tablet - 1 tablet oral every 6 hrs as needed for pain Electronic Signature(s) Signed: 04/05/2019 6:19:43 PM By: Linton Ham MD Signed: 04/05/2019 7:05:19 PM By: Baruch Gouty RN, [name] Entered By: Baruch Gouty on 04/05/2019 13:52:18 -------------------------------------------------------------------------------- Prescription  04/05/2019 Patient Name: Taylor Patterson. Provider: Linton Ham MD Date of Birth: [birth year] NPI#: [id number] Sex: F DEA#: [id number] Phone #: [phone] License #: [id number] Patient Address: [address] [place] [address] [place], [place] [address] [phone] Allergies coconut Reaction: hives Severity: Severe kiwi Reaction: esophageal swelling Severity: Severe bee venom (honey bee) Reaction: hives, sweling Severity: Severe doxycycline Reaction: swelling Severity: Severe latex Reaction: rash Severity: Moderate egg Reaction: throat swelling Sulfa (Sulfonamide Antibiotics) Reaction: unknown Medication Note: This prescription was automatically generated because the medication is a controlled substance, and cannot be prescribed electronically. Medication: Route: Strength: Form: tramadol oral 50 mg tablet Class: OPIOID ANALGESICS Dose: Frequency / Time: Indication: 1 1 tablet oral every 6 hrs as needed for pain pain Number of Refills: Number of Units: 1 Thirty (30) Tablet(s) Generic Substitution: Start Date: End Date: Administered at [facility]: No Note to Pharmacy: Signature(s): Date(s): Electronic Signature(s) Signed: 04/05/2019 6:19:43 PM By: Linton Ham MD Signed: 04/05/2019 7:05:19 PM By: Baruch Gouty RN, [name] Entered By: Baruch Gouty on 04/05/2019 13:52:18 --------------------------------------------------------------------------------  Problem List Details Patient Name: Date of Service: Taylor Patterson 04/05/2019 1:00 PM Medical Record [id number] Patient Account Number: [id number] Date of Birth/Sex: Treating RN: 29-Apr-1976 (43 y.o. Elam Dutch Primary Care Provider: Other Clinician: Leighton Ruff Referring Provider: Treating Provider/Extender:Chandan Fly, Gwyndolyn Saxon, [place] Siren in Treatment:  12 Active Problems ICD-10 Evaluated Encounter Code Description Active Date Today Diagnosis L97.211 Non-pressure chronic ulcer of right calf limited to 01/07/2019 No Yes breakdown of skin E11.9 Type 2 diabetes mellitus without  to get to looks less than vibrant. Electronic Signature(s) Signed: 04/05/2019 6:19:43 PM By: Linton Ham MD Entered By: Linton Ham on 04/05/2019 13:47:20 -------------------------------------------------------------------------------- Physician Orders Details Patient Name: Date of Service: Taylor Patterson 04/05/2019 1:00 PM Medical Record [id number] Patient Account Number:  [id number] Date of Birth/Sex: Treating RN: 14-Aug-1975 (43 y.o. 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 - Friday Dressing Change Frequency Wound #2 Right,Medial Lower Leg Do not change entire dressing for one week. Skin Barriers/Peri-Wound Care TCA Cream or Ointment - to left leg daily Wound #2 Right,Medial Lower Leg Moisturizing lotion - to leg Wound Cleansing Wound #2 Right,Medial Lower Leg May shower with protection. Primary Wound Dressing Wound #2 Right,Medial Lower Leg Endoform - moisten with saline Secondary Dressing Wound #2 Right,Medial Lower Leg Dry Gauze Edema Control Kerlix and Coban - Right Lower Extremity Avoid standing for long periods of time Elevate legs to the level of the heart or above for 30 minutes daily and/or when sitting, a frequency of: - throughout the day Exercise regularly Patient Medications Allergies: coconut, kiwi, bee venom (honey bee), doxycycline, latex, egg, Sulfa (Sulfonamide Antibiotics) Notifications Medication Indication Start End lidocaine prior to 04/05/2019 debridement DOSE topical 4 % cream - cream topical tramadol pain DOSE 1 - oral 50 mg tablet - 1 tablet oral every 6 hrs as needed for pain Electronic Signature(s) Signed: 04/05/2019 6:19:43 PM By: Linton Ham MD Signed: 04/05/2019 7:05:19 PM By: Baruch Gouty RN, [name] Entered By: Baruch Gouty on 04/05/2019 13:52:18 -------------------------------------------------------------------------------- Prescription  04/05/2019 Patient Name: Taylor Patterson. Provider: Linton Ham MD Date of Birth: [birth year] NPI#: [id number] Sex: F DEA#: [id number] Phone #: [phone] License #: [id number] Patient Address: [address] [place] [address] [place], [place] [address] [phone] Allergies coconut Reaction: hives Severity: Severe kiwi Reaction: esophageal swelling Severity: Severe bee venom (honey bee) Reaction: hives, sweling Severity: Severe doxycycline Reaction: swelling Severity: Severe latex Reaction: rash Severity: Moderate egg Reaction: throat swelling Sulfa (Sulfonamide Antibiotics) Reaction: unknown Medication Note: This prescription was automatically generated because the medication is a controlled substance, and cannot be prescribed electronically. Medication: Route: Strength: Form: tramadol oral 50 mg tablet Class: OPIOID ANALGESICS Dose: Frequency / Time: Indication: 1 1 tablet oral every 6 hrs as needed for pain pain Number of Refills: Number of Units: 1 Thirty (30) Tablet(s) Generic Substitution: Start Date: End Date: Administered at [facility]: No Note to Pharmacy: Signature(s): Date(s): Electronic Signature(s) Signed: 04/05/2019 6:19:43 PM By: Linton Ham MD Signed: 04/05/2019 7:05:19 PM By: Baruch Gouty RN, [name] Entered By: Baruch Gouty on 04/05/2019 13:52:18 --------------------------------------------------------------------------------  Problem List Details Patient Name: Date of Service: Taylor Patterson 04/05/2019 1:00 PM Medical Record [id number] Patient Account Number: [id number] Date of Birth/Sex: Treating RN: 29-Apr-1976 (43 y.o. Elam Dutch Primary Care Provider: Other Clinician: Leighton Ruff Referring Provider: Treating Provider/Extender:Chandan Fly, Gwyndolyn Saxon, [place] Siren in Treatment:  12 Active Problems ICD-10 Evaluated Encounter Code Description Active Date Today Diagnosis L97.211 Non-pressure chronic ulcer of right calf limited to 01/07/2019 No Yes breakdown of skin E11.9 Type 2 diabetes mellitus without  Material removed includes Subcutaneous Tissue, Slough, and Fibrin/Exudate after achieving pain control using Lidocaine 4% Topical Solution. No specimens were taken. A time out was conducted at 13:35, prior to the start of the procedure. A Minimum amount of bleeding was controlled with Pressure. The procedure was tolerated well with a pain level of 5 throughout and a pain level of 3 following the procedure. Post Debridement Measurements: 0.6cm length x 0.4cm width x 0.1cm depth; 0.019cm^3 volume. Character of Wound/Ulcer Post Debridement is improved.  Severity of Tissue Post Debridement is: Fat layer exposed. Post procedure Diagnosis Wound #2: Same as Pre-Procedure Plan Follow-up Appointments: Return Appointment in 1 week. - Friday Dressing Change Frequency: Wound #2 Right,Medial Lower Leg: Do not change entire dressing for one week. Skin Barriers/Peri-Wound Care: TCA Cream or Ointment - to left leg daily Wound #2 Right,Medial Lower Leg: Moisturizing lotion - to leg Wound Cleansing: Wound #2 Right,Medial Lower Leg: May shower with protection. Primary Wound Dressing: Wound #2 Right,Medial Lower Leg: Endoform - moisten with saline Secondary Dressing: Wound #2 Right,Medial Lower Leg: Dry Gauze Edema Control: Kerlix and Coban - Right Lower Extremity Avoid standing for long periods of time Elevate legs to the level of the heart or above for 30 minutes daily and/or when sitting, a frequency of: - throughout the day Exercise regularly The following medication(s) was prescribed: lidocaine topical 4 % cream cream topical for prior to debridement was prescribed at facility tramadol oral 50 mg tablet 1 1 tablet oral every 6 hrs as needed for pain for pain 1. She has reflux studies on October 8 2. We change the primary dressing today to endoform. 3. The rash on the left leg is gone presumably some form of contact dermatitis. Electronic Signature(s) Signed: 04/05/2019 6:19:43 PM By: Baltazar Najjar MD Signed: 04/05/2019 7:05:19 PM By: Zenaida Deed RN, [name] Entered By: Zenaida Deed on 04/05/2019 13:52:28 -------------------------------------------------------------------------------- SuperBill Details Patient Name: Date of Service: Taylor Patterson, Taylor Patterson 04/05/2019 Medical Record [id number] Patient Account Number: [id number] Date of Birth/Sex: Treating RN: 01/23/1976 (43 y.o. 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 Electronic Signature(s) Signed: 04/05/2019 6:19:43 PM By: Linton Ham MD Entered By: Linton Ham on 04/05/2019 13:47:20 -------------------------------------------------------------------------------- Physician Orders Details Patient Name: Date of Service: Taylor Patterson 04/05/2019 1:00 PM Medical Record [id number] Patient Account Number:  [id number] Date of Birth/Sex: Treating RN: 14-Aug-1975 (43 y.o. 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 - Friday Dressing Change Frequency Wound #2 Right,Medial Lower Leg Do not change entire dressing for one week. Skin Barriers/Peri-Wound Care TCA Cream or Ointment - to left leg daily Wound #2 Right,Medial Lower Leg Moisturizing lotion - to leg Wound Cleansing Wound #2 Right,Medial Lower Leg May shower with protection. Primary Wound Dressing Wound #2 Right,Medial Lower Leg Endoform - moisten with saline Secondary Dressing Wound #2 Right,Medial Lower Leg Dry Gauze Edema Control Kerlix and Coban - Right Lower Extremity Avoid standing for long periods of time Elevate legs to the level of the heart or above for 30 minutes daily and/or when sitting, a frequency of: - throughout the day Exercise regularly Patient Medications Allergies: coconut, kiwi, bee venom (honey bee), doxycycline, latex, egg, Sulfa (Sulfonamide Antibiotics) Notifications Medication Indication Start End lidocaine prior to 04/05/2019 debridement DOSE topical 4 % cream - cream topical tramadol pain DOSE 1 - oral 50 mg tablet - 1 tablet oral every 6 hrs as needed for pain Electronic Signature(s) Signed: 04/05/2019 6:19:43 PM By: Linton Ham MD Signed: 04/05/2019 7:05:19 PM By: Baruch Gouty RN, [name] Entered By: Baruch Gouty on 04/05/2019 13:52:18 -------------------------------------------------------------------------------- Prescription  04/05/2019 Patient Name: Taylor Patterson. Provider: Linton Ham MD Date of Birth: [birth year] NPI#: [id number] Sex: F DEA#: [id number] Phone #: [phone] License #: [id number] Patient Address: [address] [place] [address] [place], [place] [address] [phone] Allergies coconut Reaction: hives Severity: Severe kiwi Reaction: esophageal swelling Severity: Severe bee venom (honey bee) Reaction: hives, sweling Severity: Severe doxycycline Reaction: swelling Severity: Severe latex Reaction: rash Severity: Moderate egg Reaction: throat swelling Sulfa (Sulfonamide Antibiotics) Reaction: unknown Medication Note: This prescription was automatically generated because the medication is a controlled substance, and cannot be prescribed electronically. Medication: Route: Strength: Form: tramadol oral 50 mg tablet Class: OPIOID ANALGESICS Dose: Frequency / Time: Indication: 1 1 tablet oral every 6 hrs as needed for pain pain Number of Refills: Number of Units: 1 Thirty (30) Tablet(s) Generic Substitution: Start Date: End Date: Administered at [facility]: No Note to Pharmacy: Signature(s): Date(s): Electronic Signature(s) Signed: 04/05/2019 6:19:43 PM By: Linton Ham MD Signed: 04/05/2019 7:05:19 PM By: Baruch Gouty RN, [name] Entered By: Baruch Gouty on 04/05/2019 13:52:18 --------------------------------------------------------------------------------  Problem List Details Patient Name: Date of Service: Taylor Patterson 04/05/2019 1:00 PM Medical Record [id number] Patient Account Number: [id number] Date of Birth/Sex: Treating RN: 29-Apr-1976 (43 y.o. Elam Dutch Primary Care Provider: Other Clinician: Leighton Ruff Referring Provider: Treating Provider/Extender:Chandan Fly, Gwyndolyn Saxon, [place] Siren in Treatment:  12 Active Problems ICD-10 Evaluated Encounter Code Description Active Date Today Diagnosis L97.211 Non-pressure chronic ulcer of right calf limited to 01/07/2019 No Yes breakdown of skin E11.9 Type 2 diabetes mellitus without  Material removed includes Subcutaneous Tissue, Slough, and Fibrin/Exudate after achieving pain control using Lidocaine 4% Topical Solution. No specimens were taken. A time out was conducted at 13:35, prior to the start of the procedure. A Minimum amount of bleeding was controlled with Pressure. The procedure was tolerated well with a pain level of 5 throughout and a pain level of 3 following the procedure. Post Debridement Measurements: 0.6cm length x 0.4cm width x 0.1cm depth; 0.019cm^3 volume. Character of Wound/Ulcer Post Debridement is improved.  Severity of Tissue Post Debridement is: Fat layer exposed. Post procedure Diagnosis Wound #2: Same as Pre-Procedure Plan Follow-up Appointments: Return Appointment in 1 week. - Friday Dressing Change Frequency: Wound #2 Right,Medial Lower Leg: Do not change entire dressing for one week. Skin Barriers/Peri-Wound Care: TCA Cream or Ointment - to left leg daily Wound #2 Right,Medial Lower Leg: Moisturizing lotion - to leg Wound Cleansing: Wound #2 Right,Medial Lower Leg: May shower with protection. Primary Wound Dressing: Wound #2 Right,Medial Lower Leg: Endoform - moisten with saline Secondary Dressing: Wound #2 Right,Medial Lower Leg: Dry Gauze Edema Control: Kerlix and Coban - Right Lower Extremity Avoid standing for long periods of time Elevate legs to the level of the heart or above for 30 minutes daily and/or when sitting, a frequency of: - throughout the day Exercise regularly The following medication(s) was prescribed: lidocaine topical 4 % cream cream topical for prior to debridement was prescribed at facility tramadol oral 50 mg tablet 1 1 tablet oral every 6 hrs as needed for pain for pain 1. She has reflux studies on October 8 2. We change the primary dressing today to endoform. 3. The rash on the left leg is gone presumably some form of contact dermatitis. Electronic Signature(s) Signed: 04/05/2019 6:19:43 PM By: Baltazar Najjar MD Signed: 04/05/2019 7:05:19 PM By: Zenaida Deed RN, [name] Entered By: Zenaida Deed on 04/05/2019 13:52:28 -------------------------------------------------------------------------------- SuperBill Details Patient Name: Date of Service: Taylor Patterson, Taylor Patterson 04/05/2019 Medical Record [id number] Patient Account Number: [id number] Date of Birth/Sex: Treating RN: 01/23/1976 (43 y.o. 

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
## 2019-04-11 DIAGNOSIS — I83009 Varicose veins of unspecified lower extremity with ulcer of unspecified site: Secondary | ICD-10-CM | POA: Diagnosis not present

## 2019-04-12 ENCOUNTER — Other Ambulatory Visit: Payer: Self-pay

## 2019-04-12 ENCOUNTER — Encounter (HOSPITAL_BASED_OUTPATIENT_CLINIC_OR_DEPARTMENT_OTHER): Payer: Medicare HMO | Admitting: Internal Medicine

## 2019-04-12 DIAGNOSIS — L97812 Non-pressure chronic ulcer of other part of right lower leg with fat layer exposed: Secondary | ICD-10-CM | POA: Diagnosis not present

## 2019-04-12 DIAGNOSIS — L97212 Non-pressure chronic ulcer of right calf with fat layer exposed: Secondary | ICD-10-CM | POA: Diagnosis not present

## 2019-04-12 DIAGNOSIS — I82512 Chronic embolism and thrombosis of left femoral vein: Secondary | ICD-10-CM | POA: Diagnosis not present

## 2019-04-12 DIAGNOSIS — I872 Venous insufficiency (chronic) (peripheral): Secondary | ICD-10-CM | POA: Diagnosis not present

## 2019-04-12 DIAGNOSIS — E1140 Type 2 diabetes mellitus with diabetic neuropathy, unspecified: Secondary | ICD-10-CM | POA: Diagnosis not present

## 2019-04-12 DIAGNOSIS — E11622 Type 2 diabetes mellitus with other skin ulcer: Secondary | ICD-10-CM | POA: Diagnosis not present

## 2019-04-12 DIAGNOSIS — L84 Corns and callosities: Secondary | ICD-10-CM | POA: Diagnosis not present
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
## 2019-04-16 NOTE — Progress Notes (Signed)
This focused my attention on her arterial flow. I note that she had ABIs of 1.4 when she first came into our clinic and was 1.3 when she was here in 2016. I cannot find a more  formal arterial evaluation in [place] [facility]. The patient lives in [place] 8/28; ARTERIAL studies the patient did not have any arterial issues. Her right ABI was within the normal range at 1.24. TBI 0.96 waveforms were triphasic. On the left ABIs were 1.09 with a TBI of 0.72 and again triphasic waveforms. The area on the right first metatarsal head has closed. She still has an open area on the medial part of her right great toe. She has a small punched out area on the anterior tibia. We have been using Iodoflex here and silver alginate on the toe 9/18; since the patient was last here she was apparently in the ER and hospital in [place] with acute swelling and pain in the left leg. She was diagnosed with a DVT and started on Xarelto. She apparently has a follow-up ultrasound booked for late this month. She reports increasing swelling in her lower leg but no chest pain or shortness of breath. With regards to the wounds on her right leg. The area on right first metatarsal head is healed, the area on the right great toe is also healed. She has a small area on the right anterior tibial area that is smaller. 9/25; the patient came in today with eschar on the wound area on her right medial lower leg. The rest of the original wounds on the right great toe on the right first metatarsal head are healed. She has an extending rash on her left leg which was there last week but it is become much more confluent. She says the area burns and itches. 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 There is no other rash. She was wearing a compression stocking/support stocking took this off 2 days ago. I wonder if this is a contact dermatitis. I do not think this is related to her DVT in the left leg or the Xarelto that is used to treat it 10/2; still a small punched out area in the middle of previous scar tissue in the right anterior lower leg. We are  using Iodoflex for a prolonged period of time. The rash from last time is totally resolved with the steroid cream I gave her. 10/9; only eschar over the surface of the area we have been dealing with. I changed her to endoform last week Electronic Signature(s) Signed: 04/14/2019 10:27:00 AM By: Baltazar Najjar MD Entered By: Baltazar Najjar on 04/12/2019 13:41:32 -------------------------------------------------------------------------------- Physical Exam Details Patient Name: Date of Service: Taylor Patterson 04/12/2019 1:00 PM Medical Record [id number] Patient Account Number: [id number] Date of Birth/Sex: Treating RN: 11/08/1975 (43 y.o. [name] Primary Care Provider: Juluis Rainier Other Clinician: Referring Provider: Treating Provider/Extender:Eriel Dunckel, Wardell Heath, Rhys Martini in Treatment: 13 Constitutional Sitting or standing Blood Pressure is within target range for patient.. Pulse regular and within target range for patient.Marland Kitchen Respirations regular, non-labored and within target range.. Temperature is normal and within the target range for the patient.Marland Kitchen Appears in no distress. Notes Wound exam; right medial lower leg. I removed eschar with a #3 curette is difficult to see if there is anything open here. There was no bleeding Electronic Signature(s) Signed: 04/14/2019 10:27:00 AM By: Baltazar Najjar MD Entered By: Baltazar Najjar on 04/12/2019 13:42:18 -------------------------------------------------------------------------------- Physician Orders Details  QUERIDA, BERETTA ([id number]) Visit Report for 04/12/2019 Debridement Details Patient Name: Date of Service: NISHAT, LIVINGSTON 04/12/2019 1:00 PM Medical Record [id number] Patient Account Number: [id number] Date of Birth/Sex: Treating RN: [birth year] (43 y.o. [name] Primary Care Provider: Juluis Rainier Other Clinician: Referring Provider: Treating Provider/Extender:Clydell Sposito, Wardell Heath, Rhys Martini in Treatment: 13 Debridement Performed for Wound #2 Right,Medial Lower Leg Assessment: Performed By: Physician Maxwell Caul., MD Debridement Type: Debridement Severity of Tissue Pre Fat layer exposed Debridement: Level of Consciousness (Pre- Awake and Alert procedure): Pre-procedure Verification/Time Out Taken: Yes - 13:36 Start Time: 13:36 Total Area Debrided (L x W): 0.3 (cm) x 0.3 (cm) = 0.09 (cm) Tissue and other material Non-Viable, Eschar debrided: Level: Non-Viable Tissue Debridement Description: Selective/Open Wound Instrument: Curette Bleeding: None End Time: 13:37 Procedural Pain: 0 Post Procedural Pain: 0 Response to Treatment: Procedure was tolerated well Level of Consciousness Awake and Alert (Post-procedure): Post Debridement Measurements of Total Wound Length: (cm) 0.3 Width: (cm) 0.3 Depth: (cm) 0.1 Volume: (cm) 0.007 Character of Wound/Ulcer Post Improved Debridement: Severity of Tissue Post Debridement: Fat layer exposed Post Procedure Diagnosis Same as Pre-procedure Electronic Signature(s) Signed: 04/14/2019 10:27:00 AM By: Baltazar Najjar MD Signed: 04/16/2019 5:52:09 PM By: Zandra Abts RN, [name] Entered By: Baltazar Najjar on 04/12/2019 13:40:17 -------------------------------------------------------------------------------- HPI Details Patient Name: Date of Service: Taylor Patterson 04/12/2019 1:00 PM Medical Record [id number] Patient Account Number: [id number] Date of Birth/Sex: Treating  RN: 04/15/1976 (43 y.o. [name] Primary Care Provider: Juluis Rainier Other Clinician: Referring Provider: Treating Provider/Extender:Lene Mckay, Wardell Heath, Rhys Martini in Treatment: 13 History of Present Illness Location: right leg Severity: venous ulcer Timing: 7 months HPI Description: this is a patient from [facility]. She has been followed by [facility] and vascular practice for a chronic wound on her right anterior lateral leg related to venous insufficiency. She tells [place] she has had extensive treatment for this over the years up to and including a single apligraf and thera skin. Nevertheless this is not been healing. It has gotten smaller but it apparently has been in the current condition for the last 4-5 months. We do not have arterial studies although she has had venous studies done in September 2015. At that point she had superficial thrombosis of the right thigh greater saphenous vein. I believe she had some form of ablative procedure done. She is now in the [place] area. She was seen at [facility] last month with a blood sugar over 500.she had GU candidiasis and hyperglycemia. She was not put on insulin she remains on metformin. 09/25/15 the patient returns today with a wound that actually paradoxically looks quite healthy. There is extensive surrounding hemosiderin likely from clonic of venous insufficiency. The issue we spent some time on is that her hemoglobin A1c is 15.6 her blood sugar today is 510. She has a appointment next week with her primary physician I think at [facility] physicians on [address]. I'm going to have our staff phone up to see if they can get her in this afternoon to start insulin 10/09/14; the patient returns today of the wound actually looks fairly healthy but the dimensions are not much different.there is no surrounding infection but there is some drainage. I had some trouble feeling her peripheral pulses. Her ABIs done in  this clinic is greater than 1.3. she has severe surrounding venous insufficiency changes with hemosiderin deposits. The edema is well controlled 10/16/14; the patient returns today with a healthy-looking wound. Lites surface debridement  Number: [id number] Date of Birth/Sex: Treating RN: 1976/06/15 (43 y.o. 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 Facility Procedures CPT4 Code Description: [id number] 97597 - DEBRIDE WOUND 1ST 20 SQ CM OR < ICD-10 Diagnosis Description L97.211 Non-pressure chronic ulcer of right calf limited to breakd Modifier: own of skin Quantity: 1 Physician Procedures CPT4 Code Description: [id number] 97597 - WC PHYS DEBR WO ANESTH 20 SQ CM ICD-10 Diagnosis Description L97.211 Non-pressure chronic ulcer of right calf limited to  breakdo Modifier: wn of skin Quantity: 1 Electronic Signature(s) Signed: 04/14/2019 10:27:00 AM By: Baltazar Najjar MD Entered By: Baltazar Najjar on 04/12/2019 13:43:46  QUERIDA, BERETTA ([id number]) Visit Report for 04/12/2019 Debridement Details Patient Name: Date of Service: NISHAT, LIVINGSTON 04/12/2019 1:00 PM Medical Record [id number] Patient Account Number: [id number] Date of Birth/Sex: Treating RN: [birth year] (43 y.o. [name] Primary Care Provider: Juluis Rainier Other Clinician: Referring Provider: Treating Provider/Extender:Clydell Sposito, Wardell Heath, Rhys Martini in Treatment: 13 Debridement Performed for Wound #2 Right,Medial Lower Leg Assessment: Performed By: Physician Maxwell Caul., MD Debridement Type: Debridement Severity of Tissue Pre Fat layer exposed Debridement: Level of Consciousness (Pre- Awake and Alert procedure): Pre-procedure Verification/Time Out Taken: Yes - 13:36 Start Time: 13:36 Total Area Debrided (L x W): 0.3 (cm) x 0.3 (cm) = 0.09 (cm) Tissue and other material Non-Viable, Eschar debrided: Level: Non-Viable Tissue Debridement Description: Selective/Open Wound Instrument: Curette Bleeding: None End Time: 13:37 Procedural Pain: 0 Post Procedural Pain: 0 Response to Treatment: Procedure was tolerated well Level of Consciousness Awake and Alert (Post-procedure): Post Debridement Measurements of Total Wound Length: (cm) 0.3 Width: (cm) 0.3 Depth: (cm) 0.1 Volume: (cm) 0.007 Character of Wound/Ulcer Post Improved Debridement: Severity of Tissue Post Debridement: Fat layer exposed Post Procedure Diagnosis Same as Pre-procedure Electronic Signature(s) Signed: 04/14/2019 10:27:00 AM By: Baltazar Najjar MD Signed: 04/16/2019 5:52:09 PM By: Zandra Abts RN, [name] Entered By: Baltazar Najjar on 04/12/2019 13:40:17 -------------------------------------------------------------------------------- HPI Details Patient Name: Date of Service: Taylor Patterson 04/12/2019 1:00 PM Medical Record [id number] Patient Account Number: [id number] Date of Birth/Sex: Treating  RN: 04/15/1976 (43 y.o. [name] Primary Care Provider: Juluis Rainier Other Clinician: Referring Provider: Treating Provider/Extender:Lene Mckay, Wardell Heath, Rhys Martini in Treatment: 13 History of Present Illness Location: right leg Severity: venous ulcer Timing: 7 months HPI Description: this is a patient from [facility]. She has been followed by [facility] and vascular practice for a chronic wound on her right anterior lateral leg related to venous insufficiency. She tells [place] she has had extensive treatment for this over the years up to and including a single apligraf and thera skin. Nevertheless this is not been healing. It has gotten smaller but it apparently has been in the current condition for the last 4-5 months. We do not have arterial studies although she has had venous studies done in September 2015. At that point she had superficial thrombosis of the right thigh greater saphenous vein. I believe she had some form of ablative procedure done. She is now in the [place] area. She was seen at [facility] last month with a blood sugar over 500.she had GU candidiasis and hyperglycemia. She was not put on insulin she remains on metformin. 09/25/15 the patient returns today with a wound that actually paradoxically looks quite healthy. There is extensive surrounding hemosiderin likely from clonic of venous insufficiency. The issue we spent some time on is that her hemoglobin A1c is 15.6 her blood sugar today is 510. She has a appointment next week with her primary physician I think at [facility] physicians on [address]. I'm going to have our staff phone up to see if they can get her in this afternoon to start insulin 10/09/14; the patient returns today of the wound actually looks fairly healthy but the dimensions are not much different.there is no surrounding infection but there is some drainage. I had some trouble feeling her peripheral pulses. Her ABIs done in  this clinic is greater than 1.3. she has severe surrounding venous insufficiency changes with hemosiderin deposits. The edema is well controlled 10/16/14; the patient returns today with a healthy-looking wound. Lites surface debridement  Number: [id number] Date of Birth/Sex: Treating RN: 1976/06/15 (43 y.o. 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 Facility Procedures CPT4 Code Description: [id number] 97597 - DEBRIDE WOUND 1ST 20 SQ CM OR < ICD-10 Diagnosis Description L97.211 Non-pressure chronic ulcer of right calf limited to breakd Modifier: own of skin Quantity: 1 Physician Procedures CPT4 Code Description: [id number] 97597 - WC PHYS DEBR WO ANESTH 20 SQ CM ICD-10 Diagnosis Description L97.211 Non-pressure chronic ulcer of right calf limited to  breakdo Modifier: wn of skin Quantity: 1 Electronic Signature(s) Signed: 04/14/2019 10:27:00 AM By: Baltazar Najjar MD Entered By: Baltazar Najjar on 04/12/2019 13:43:46  Number: [id number] Date of Birth/Sex: Treating RN: 1976/06/15 (43 y.o. 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 Facility Procedures CPT4 Code Description: [id number] 97597 - DEBRIDE WOUND 1ST 20 SQ CM OR < ICD-10 Diagnosis Description L97.211 Non-pressure chronic ulcer of right calf limited to breakd Modifier: own of skin Quantity: 1 Physician Procedures CPT4 Code Description: [id number] 97597 - WC PHYS DEBR WO ANESTH 20 SQ CM ICD-10 Diagnosis Description L97.211 Non-pressure chronic ulcer of right calf limited to  breakdo Modifier: wn of skin Quantity: 1 Electronic Signature(s) Signed: 04/14/2019 10:27:00 AM By: Baltazar Najjar MD Entered By: Baltazar Najjar on 04/12/2019 13:43:46  This focused my attention on her arterial flow. I note that she had ABIs of 1.4 when she first came into our clinic and was 1.3 when she was here in 2016. I cannot find a more  formal arterial evaluation in [place] [facility]. The patient lives in [place] 8/28; ARTERIAL studies the patient did not have any arterial issues. Her right ABI was within the normal range at 1.24. TBI 0.96 waveforms were triphasic. On the left ABIs were 1.09 with a TBI of 0.72 and again triphasic waveforms. The area on the right first metatarsal head has closed. She still has an open area on the medial part of her right great toe. She has a small punched out area on the anterior tibia. We have been using Iodoflex here and silver alginate on the toe 9/18; since the patient was last here she was apparently in the ER and hospital in [place] with acute swelling and pain in the left leg. She was diagnosed with a DVT and started on Xarelto. She apparently has a follow-up ultrasound booked for late this month. She reports increasing swelling in her lower leg but no chest pain or shortness of breath. With regards to the wounds on her right leg. The area on right first metatarsal head is healed, the area on the right great toe is also healed. She has a small area on the right anterior tibial area that is smaller. 9/25; the patient came in today with eschar on the wound area on her right medial lower leg. The rest of the original wounds on the right great toe on the right first metatarsal head are healed. She has an extending rash on her left leg which was there last week but it is become much more confluent. She says the area burns and itches. 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 There is no other rash. She was wearing a compression stocking/support stocking took this off 2 days ago. I wonder if this is a contact dermatitis. I do not think this is related to her DVT in the left leg or the Xarelto that is used to treat it 10/2; still a small punched out area in the middle of previous scar tissue in the right anterior lower leg. We are  using Iodoflex for a prolonged period of time. The rash from last time is totally resolved with the steroid cream I gave her. 10/9; only eschar over the surface of the area we have been dealing with. I changed her to endoform last week Electronic Signature(s) Signed: 04/14/2019 10:27:00 AM By: Baltazar Najjar MD Entered By: Baltazar Najjar on 04/12/2019 13:41:32 -------------------------------------------------------------------------------- Physical Exam Details Patient Name: Date of Service: Taylor Patterson 04/12/2019 1:00 PM Medical Record [id number] Patient Account Number: [id number] Date of Birth/Sex: Treating RN: 11/08/1975 (43 y.o. [name] Primary Care Provider: Juluis Rainier Other Clinician: Referring Provider: Treating Provider/Extender:Eriel Dunckel, Wardell Heath, Rhys Martini in Treatment: 13 Constitutional Sitting or standing Blood Pressure is within target range for patient.. Pulse regular and within target range for patient.Marland Kitchen Respirations regular, non-labored and within target range.. Temperature is normal and within the target range for the patient.Marland Kitchen Appears in no distress. Notes Wound exam; right medial lower leg. I removed eschar with a #3 curette is difficult to see if there is anything open here. There was no bleeding Electronic Signature(s) Signed: 04/14/2019 10:27:00 AM By: Baltazar Najjar MD Entered By: Baltazar Najjar on 04/12/2019 13:42:18 -------------------------------------------------------------------------------- Physician Orders Details  Number: [id number] Date of Birth/Sex: Treating RN: 1976/06/15 (43 y.o. 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 Facility Procedures CPT4 Code Description: [id number] 97597 - DEBRIDE WOUND 1ST 20 SQ CM OR < ICD-10 Diagnosis Description L97.211 Non-pressure chronic ulcer of right calf limited to breakd Modifier: own of skin Quantity: 1 Physician Procedures CPT4 Code Description: [id number] 97597 - WC PHYS DEBR WO ANESTH 20 SQ CM ICD-10 Diagnosis Description L97.211 Non-pressure chronic ulcer of right calf limited to  breakdo Modifier: wn of skin Quantity: 1 Electronic Signature(s) Signed: 04/14/2019 10:27:00 AM By: Baltazar Najjar MD Entered By: Baltazar Najjar on 04/12/2019 13:43:46  Number: [id number] Date of Birth/Sex: Treating RN: 1976/06/15 (43 y.o. 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 Facility Procedures CPT4 Code Description: [id number] 97597 - DEBRIDE WOUND 1ST 20 SQ CM OR < ICD-10 Diagnosis Description L97.211 Non-pressure chronic ulcer of right calf limited to breakd Modifier: own of skin Quantity: 1 Physician Procedures CPT4 Code Description: [id number] 97597 - WC PHYS DEBR WO ANESTH 20 SQ CM ICD-10 Diagnosis Description L97.211 Non-pressure chronic ulcer of right calf limited to  breakdo Modifier: wn of skin Quantity: 1 Electronic Signature(s) Signed: 04/14/2019 10:27:00 AM By: Baltazar Najjar MD Entered By: Baltazar Najjar on 04/12/2019 13:43:46  QUERIDA, BERETTA ([id number]) Visit Report for 04/12/2019 Debridement Details Patient Name: Date of Service: NISHAT, LIVINGSTON 04/12/2019 1:00 PM Medical Record [id number] Patient Account Number: [id number] Date of Birth/Sex: Treating RN: [birth year] (43 y.o. [name] Primary Care Provider: Juluis Rainier Other Clinician: Referring Provider: Treating Provider/Extender:Clydell Sposito, Wardell Heath, Rhys Martini in Treatment: 13 Debridement Performed for Wound #2 Right,Medial Lower Leg Assessment: Performed By: Physician Maxwell Caul., MD Debridement Type: Debridement Severity of Tissue Pre Fat layer exposed Debridement: Level of Consciousness (Pre- Awake and Alert procedure): Pre-procedure Verification/Time Out Taken: Yes - 13:36 Start Time: 13:36 Total Area Debrided (L x W): 0.3 (cm) x 0.3 (cm) = 0.09 (cm) Tissue and other material Non-Viable, Eschar debrided: Level: Non-Viable Tissue Debridement Description: Selective/Open Wound Instrument: Curette Bleeding: None End Time: 13:37 Procedural Pain: 0 Post Procedural Pain: 0 Response to Treatment: Procedure was tolerated well Level of Consciousness Awake and Alert (Post-procedure): Post Debridement Measurements of Total Wound Length: (cm) 0.3 Width: (cm) 0.3 Depth: (cm) 0.1 Volume: (cm) 0.007 Character of Wound/Ulcer Post Improved Debridement: Severity of Tissue Post Debridement: Fat layer exposed Post Procedure Diagnosis Same as Pre-procedure Electronic Signature(s) Signed: 04/14/2019 10:27:00 AM By: Baltazar Najjar MD Signed: 04/16/2019 5:52:09 PM By: Zandra Abts RN, [name] Entered By: Baltazar Najjar on 04/12/2019 13:40:17 -------------------------------------------------------------------------------- HPI Details Patient Name: Date of Service: Taylor Patterson 04/12/2019 1:00 PM Medical Record [id number] Patient Account Number: [id number] Date of Birth/Sex: Treating  RN: 04/15/1976 (43 y.o. [name] Primary Care Provider: Juluis Rainier Other Clinician: Referring Provider: Treating Provider/Extender:Lene Mckay, Wardell Heath, Rhys Martini in Treatment: 13 History of Present Illness Location: right leg Severity: venous ulcer Timing: 7 months HPI Description: this is a patient from [facility]. She has been followed by [facility] and vascular practice for a chronic wound on her right anterior lateral leg related to venous insufficiency. She tells [place] she has had extensive treatment for this over the years up to and including a single apligraf and thera skin. Nevertheless this is not been healing. It has gotten smaller but it apparently has been in the current condition for the last 4-5 months. We do not have arterial studies although she has had venous studies done in September 2015. At that point she had superficial thrombosis of the right thigh greater saphenous vein. I believe she had some form of ablative procedure done. She is now in the [place] area. She was seen at [facility] last month with a blood sugar over 500.she had GU candidiasis and hyperglycemia. She was not put on insulin she remains on metformin. 09/25/15 the patient returns today with a wound that actually paradoxically looks quite healthy. There is extensive surrounding hemosiderin likely from clonic of venous insufficiency. The issue we spent some time on is that her hemoglobin A1c is 15.6 her blood sugar today is 510. She has a appointment next week with her primary physician I think at [facility] on [address]. I'm going to have our staff phone up to see if they can get her in this afternoon to start insulin 10/09/14; the patient returns today of the wound actually looks fairly healthy but the dimensions are not much different.there is no surrounding infection but there is some drainage. I had some trouble feeling her peripheral pulses. Her ABIs done in  this clinic is greater than 1.3. she has severe surrounding venous insufficiency changes with hemosiderin deposits. The edema is well controlled 10/16/14; the patient returns today with a healthy-looking wound. Lites surface debridement

## 2019-04-19 ENCOUNTER — Other Ambulatory Visit: Payer: Self-pay

## 2019-04-19 ENCOUNTER — Encounter (HOSPITAL_BASED_OUTPATIENT_CLINIC_OR_DEPARTMENT_OTHER): Payer: Medicare HMO | Admitting: Internal Medicine

## 2019-04-19 DIAGNOSIS — I872 Venous insufficiency (chronic) (peripheral): Secondary | ICD-10-CM | POA: Diagnosis not present

## 2019-04-19 DIAGNOSIS — E1140 Type 2 diabetes mellitus with diabetic neuropathy, unspecified: Secondary | ICD-10-CM | POA: Diagnosis not present

## 2019-04-19 DIAGNOSIS — L97219 Non-pressure chronic ulcer of right calf with unspecified severity: Secondary | ICD-10-CM | POA: Diagnosis not present

## 2019-04-19 DIAGNOSIS — E11622 Type 2 diabetes mellitus with other skin ulcer: Secondary | ICD-10-CM | POA: Diagnosis not present

## 2019-04-19 DIAGNOSIS — I82512 Chronic embolism and thrombosis of left femoral vein: Secondary | ICD-10-CM | POA: Diagnosis not present

## 2019-04-19 DIAGNOSIS — L84 Corns and callosities: Secondary | ICD-10-CM | POA: Diagnosis not present

## 2019-04-19 DIAGNOSIS — L97812 Non-pressure chronic ulcer of other part of right lower leg with fat layer exposed: Secondary | ICD-10-CM | POA: Diagnosis not present

## 2019-04-22 NOTE — Progress Notes (Signed)
is a none present amount of drainage noted. The wound margin is flat and intact. There is no granulation within the wound bed. There is no necrotic tissue within the wound bed. 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 Patient can be discharged from this clinic 2. We gave her instructions for elastic therapy in [place] as well as her measurements for 20/30 below-knee stockings 3. She has seen vascular in [place]. They are apparently going to do ablations on the right leg. She is already had a prior history of ablations in this area. 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 The wound margin is flat and intact. There is no granulation within the wound bed. There is no necrotic tissue within the wound bed. 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 Patient can be discharged from this clinic 2. We gave her instructions for elastic therapy in [place] as well as her measurements for 20/30 below-knee stockings 3. She has seen vascular in [place]. They are apparently going to do ablations on the right leg. She is already had a prior history of ablations in this area. 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 Facility Procedures CPT4 Code: [id number] Description: 99213 - WOUND CARE VISIT-LEV 3 EST PT Modifier: Quantity: 1 Physician Procedures Electronic Signature(s) Signed: 04/19/2019 6:00:55 PM By: Zandra Abts RN, [name] Signed: 04/22/2019 5:54:03 PM By: Baltazar Najjar MD Previous Signature: 04/19/2019 5:33:39 PM Version By: Baltazar Najjar MD Entered By: Zandra Abts on 04/19/2019 17:42:32  Taylor Patterson, Taylor Patterson ([id number]) Visit Report for 04/19/2019 HPI Details Patient Name: Date of Service: Taylor Patterson, Taylor Patterson 04/19/2019 9:45 AM Medical Record [id number] Patient Account Number: [id number] Date of Birth/Sex: Treating RN: 1976-03-23 (43 y.o. [name] Primary Care Provider: Juluis Rainier Other Clinician: Referring Provider: Treating Provider/Extender:[name], Wardell Heath, Rhys Martini in Treatment: 14 History of Present Illness Location: right leg Severity: venous ulcer Timing: 7 months HPI Description: this is a patient from [facility]. She has been followed by avein and vascular practice for a chronic wound on her right anterior lateral leg related to venous insufficiency. She tells [place] she has had extensive treatment for this over the years up to and including a single apligraf and thera skin. Nevertheless this is not been healing. It has gotten smaller but it apparently has been in the current condition for the last 4-5 months. We do not have arterial studies although she has had venous studies done in September 2015. At that point she had superficial thrombosis of the right thigh greater saphenous vein. I believe she had some form of ablative procedure done. She is now in the [place] area. She was seen at [facility] last month with a blood sugar over 500.she had GU candidiasis and hyperglycemia. She was not put on insulin she remains on metformin. 09/25/15 the patient returns today with a wound that actually paradoxically looks quite healthy. There is extensive surrounding hemosiderin likely from clonic of venous insufficiency. The issue we spent some time on is that her hemoglobin A1c is 15.6 her blood sugar today is 510. She has a appointment next week with her primary physician I think at [facility] physicians on [address]. I'm going to have our staff phone up to see if they can get her in this afternoon to start insulin 10/09/14;  the patient returns today of the wound actually looks fairly healthy but the dimensions are not much different.there is no surrounding infection but there is some drainage. I had some trouble feeling her peripheral pulses. Her ABIs done in this clinic is greater than 1.3. she has severe surrounding venous insufficiency changes with hemosiderin deposits. The edema is well controlled 10/16/14; the patient returns today with a healthy-looking wound. Lites surface debridement was done, the wound cleans up nicely. She has a thin rim of epithelialization. Her arterial studies have been done and show triphasic waves. she is approved for Apligraf but I don't know how to explain this to the patient in terms of her out-of-pocket costs 10/23/14. The wound looked fairly healthy. Lites surface debridement the, selective, using a curette to to remove surface eschar. Apligraf applied #1 11/06/14; patient required an aggressive selective debridement. I am not completely certain, nor MI completely happy with having to do this. Apligraf #2 applied 11/20/14 Patient has fibrinous escar. debridement tolerated poorly. Appligraf #3 applied 12/04/14 debridement of fibrinous eschar. Apligraf #4 applied. Condition of the wound looks somewhat better 12/18/14 once again a extensive debridement of nonviable surface was done. Post debridement this looks improved although this is her last Apligraf. 12/25/14; patient arrives today with an extensive area of cellulitis around the wound. This was supposed to be a nurse check however I was called in to look at this. 01/01/15; this infection angle of this is much improved. Culture of the wound last week grew Escherichia coli she will complete a second week of ciprofloxacin. The doxycycline can stop. She has chronic venous stasis around the wound which is now back to its original appearance [  is a none present amount of drainage noted. The wound margin is flat and intact. There is no granulation within the wound bed. There is no necrotic tissue within the wound bed. 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 Patient can be discharged from this clinic 2. We gave her instructions for elastic therapy in [place] as well as her measurements for 20/30 below-knee stockings 3. She has seen vascular in [place]. They are apparently going to do ablations on the right leg. She is already had a prior history of ablations in this area. 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 The wound margin is flat and intact. There is no granulation within the wound bed. There is no necrotic tissue within the wound bed. 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 Patient can be discharged from this clinic 2. We gave her instructions for elastic therapy in [place] as well as her measurements for 20/30 below-knee stockings 3. She has seen vascular in [place]. They are apparently going to do ablations on the right leg. She is already had a prior history of ablations in this area. 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 Facility Procedures CPT4 Code: [id number] Description: 99213 - WOUND CARE VISIT-LEV 3 EST PT Modifier: Quantity: 1 Physician Procedures Electronic Signature(s) Signed: 04/19/2019 6:00:55 PM By: Zandra Abts RN, [name] Signed: 04/22/2019 5:54:03 PM By: Baltazar Najjar MD Previous Signature: 04/19/2019 5:33:39 PM Version By: Baltazar Najjar MD Entered By: Zandra Abts on 04/19/2019 17:42:32  Taylor Patterson, Taylor Patterson ([id number]) Visit Report for 04/19/2019 HPI Details Patient Name: Date of Service: Taylor Patterson, Taylor Patterson 04/19/2019 9:45 AM Medical Record [id number] Patient Account Number: [id number] Date of Birth/Sex: Treating RN: 1976-03-23 (43 y.o. [name] Primary Care Provider: Juluis Rainier Other Clinician: Referring Provider: Treating Provider/Extender:[name], Wardell Heath, Rhys Martini in Treatment: 14 History of Present Illness Location: right leg Severity: venous ulcer Timing: 7 months HPI Description: this is a patient from [facility]. She has been followed by [facility] and vascular practice for a chronic wound on her right anterior lateral leg related to venous insufficiency. She tells [place] she has had extensive treatment for this over the years up to and including a single apligraf and thera skin. Nevertheless this is not been healing. It has gotten smaller but it apparently has been in the current condition for the last 4-5 months. We do not have arterial studies although she has had venous studies done in September 2015. At that point she had superficial thrombosis of the right thigh greater saphenous vein. I believe she had some form of ablative procedure done. She is now in the [place] area. She was seen at [facility] last month with a blood sugar over 500.she had GU candidiasis and hyperglycemia. She was not put on insulin she remains on metformin. 09/25/15 the patient returns today with a wound that actually paradoxically looks quite healthy. There is extensive surrounding hemosiderin likely from clonic of venous insufficiency. The issue we spent some time on is that her hemoglobin A1c is 15.6 her blood sugar today is 510. She has a appointment next week with her primary physician I think at [facility] physicians on [address]. I'm going to have our staff phone up to see if they can get her in this afternoon to start insulin 10/09/14;  the patient returns today of the wound actually looks fairly healthy but the dimensions are not much different.there is no surrounding infection but there is some drainage. I had some trouble feeling her peripheral pulses. Her ABIs done in this clinic is greater than 1.3. she has severe surrounding venous insufficiency changes with hemosiderin deposits. The edema is well controlled 10/16/14; the patient returns today with a healthy-looking wound. Lites surface debridement was done, the wound cleans up nicely. She has a thin rim of epithelialization. Her arterial studies have been done and show triphasic waves. she is approved for Apligraf but I don't know how to explain this to the patient in terms of her out-of-pocket costs 10/23/14. The wound looked fairly healthy. Lites surface debridement the, selective, using a curette to to remove surface eschar. Apligraf applied #1 11/06/14; patient required an aggressive selective debridement. I am not completely certain, nor MI completely happy with having to do this. Apligraf #2 applied 11/20/14 Patient has fibrinous escar. debridement tolerated poorly. Appligraf #3 applied 12/04/14 debridement of fibrinous eschar. Apligraf #4 applied. Condition of the wound looks somewhat better 12/18/14 once again a extensive debridement of nonviable surface was done. Post debridement this looks improved although this is her last Apligraf. 12/25/14; patient arrives today with an extensive area of cellulitis around the wound. This was supposed to be a nurse check however I was called in to look at this. 01/01/15; this infection angle of this is much improved. Culture of the wound last week grew Escherichia coli she will complete a second week of ciprofloxacin. The doxycycline can stop. She has chronic venous stasis around the wound which is now back to its original appearance [  first metatarsal head is healed, the area on the right great toe is also healed. She has a small area on the right anterior tibial area that is smaller. 9/25; the patient came in today with eschar on the wound area on her right medial lower leg. The rest of the original wounds on the right great toe on the right first metatarsal head are healed. She has an extending rash on her left leg which was there last week but it is become much more confluent. She says the area burns and itches. 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 There is no other rash. She was wearing a compression stocking/support stocking took this off 2 days ago. I wonder  if this is a contact dermatitis. I do not think this is related to her DVT in the left leg or the Xarelto that is used to treat it 10/2; still a small punched out area in the middle of previous scar tissue in the right anterior lower leg. We are using Iodoflex for a prolonged period of time. The rash from last time is totally resolved with the steroid cream I gave her. 10/9; only eschar over the surface of the area we have been dealing with. I changed her to endoform last week 10/16; the patient is healed on the right lower leg. She tells me that she had reflux studies done on the right leg in [place]. They are going to do ablations. I know she is already had ablations on this leg. We gave her instructions for elastic therapy in [place] 20/30 below-knee stockings I do not have any of this information. Electronic Signature(s) Signed: 04/19/2019 5:33:39 PM By: Linton Ham MD Entered By: Linton Ham on 04/19/2019 10:31:04 -------------------------------------------------------------------------------- Physical Exam Details Patient Name: Date of Service: Taylor Patterson, Taylor Patterson 04/19/2019 9:45 AM Medical Record [id number] Patient Account Number: [id number] Date of Birth/Sex: Treating RN: [birth year] (43 y.o. Nancy Fetter Primary Care Provider: Leighton Ruff Other Clinician: Referring Provider: Treating Provider/Extender:Emmerson Shuffield, Gwyndolyn Saxon, [place] Siren in Treatment: 14 Constitutional Sitting or standing Blood Pressure is within target range for patient.. Pulse regular and within target range for patient.Marland Kitchen Respirations regular, non-labored and within target range.. Temperature is normal and within the target range for the patient.Marland Kitchen Appears in no distress. Notes Wound exam; right medial lower leg. The area is closed. She has a localized area of hemosiderin deposition. No major edema no evidence of infection Electronic Signature(s) Signed: 04/19/2019 5:33:39 PM By:  Linton Ham MD Entered By: Linton Ham on 04/19/2019 10:31:41 -------------------------------------------------------------------------------- Physician Orders Details Patient Name: Date of Service: Taylor Patterson, Taylor Patterson 04/19/2019 9:45 AM Medical Record [id number] Patient Account Number: [id number] Date of Birth/Sex: Treating RN: [birth year] (43 y.o. 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 She has a small area on the right anterior tibial area that is smaller. 9/25; the patient came in today with eschar on the wound area on her right medial lower leg. The rest of the original wounds on the right great toe on the right first metatarsal head are healed. She has an extending rash on her left leg which was there last week but it is become much more confluent. She says the area burns and itches. 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 There is no other rash. She was wearing a compression stocking/support stocking took this off 2 days ago. I wonder  if this is a contact dermatitis. I do not think this is related to her DVT in the left leg or the Xarelto that is used to treat it 10/2; still a small punched out area in the middle of previous scar tissue in the right anterior lower leg. We are using Iodoflex for a prolonged period of time. The rash from last time is totally resolved with the steroid cream I gave her. 10/9; only eschar over the surface of the area we have been dealing with. I changed her to endoform last week 10/16; the patient is healed on the right lower leg. She tells me that she had reflux studies done on the right leg in [place]. They are going to do ablations. I know she is already had ablations on this leg. We gave her instructions for elastic therapy in [place] 20/30 below-knee stockings I do not have any of this information. Electronic Signature(s) Signed: 04/19/2019 5:33:39 PM By: Linton Ham MD Entered By: Linton Ham on 04/19/2019 10:31:04 -------------------------------------------------------------------------------- Physical Exam Details Patient Name: Date of Service: Taylor Patterson, Taylor Patterson 04/19/2019 9:45 AM Medical Record [id number] Patient Account Number: [id number] Date of Birth/Sex: Treating RN: [birth year] (43 y.o. Nancy Fetter Primary Care Provider: Leighton Ruff Other Clinician: Referring Provider: Treating Provider/Extender:Emmerson Shuffield, Gwyndolyn Saxon, [place] Siren in Treatment: 14 Constitutional Sitting or standing Blood Pressure is within target range for patient.. Pulse regular and within target range for patient.Marland Kitchen Respirations regular, non-labored and within target range.. Temperature is normal and within the target range for the patient.Marland Kitchen Appears in no distress. Notes Wound exam; right medial lower leg. The area is closed. She has a localized area of hemosiderin deposition. No major edema no evidence of infection Electronic Signature(s) Signed: 04/19/2019 5:33:39 PM By:  Linton Ham MD Entered By: Linton Ham on 04/19/2019 10:31:41 -------------------------------------------------------------------------------- Physician Orders Details Patient Name: Date of Service: Taylor Patterson, Taylor Patterson 04/19/2019 9:45 AM Medical Record [id number] Patient Account Number: [id number] Date of Birth/Sex: Treating RN: [birth year] (43 y.o. 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 The wound margin is flat and intact. There is no granulation within the wound bed. There is no necrotic tissue within the wound bed. 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 Patient can be discharged from this clinic 2. We gave her instructions for elastic therapy in [place] as well as her measurements for 20/30 below-knee stockings 3. She has seen vascular in [place]. They are apparently going to do ablations on the right leg. She is already had a prior history of ablations in this area. 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

## 2019-04-26 DIAGNOSIS — L97818 Non-pressure chronic ulcer of other part of right lower leg with other specified severity: Secondary | ICD-10-CM | POA: Diagnosis not present

## 2019-04-26 DIAGNOSIS — Z794 Long term (current) use of insulin: Secondary | ICD-10-CM | POA: Diagnosis not present

## 2019-04-26 DIAGNOSIS — I82402 Acute embolism and thrombosis of unspecified deep veins of left lower extremity: Secondary | ICD-10-CM | POA: Diagnosis not present

## 2019-04-26 DIAGNOSIS — E669 Obesity, unspecified: Secondary | ICD-10-CM | POA: Diagnosis not present

## 2019-04-26 DIAGNOSIS — Z7901 Long term (current) use of anticoagulants: Secondary | ICD-10-CM | POA: Diagnosis not present

## 2019-04-26 DIAGNOSIS — R600 Localized edema: Secondary | ICD-10-CM | POA: Diagnosis not present

## 2019-04-26 DIAGNOSIS — E11622 Type 2 diabetes mellitus with other skin ulcer: Secondary | ICD-10-CM | POA: Diagnosis not present

## 2019-04-26 DIAGNOSIS — E785 Hyperlipidemia, unspecified: Secondary | ICD-10-CM | POA: Diagnosis not present

## 2019-04-26 DIAGNOSIS — I83018 Varicose veins of right lower extremity with ulcer other part of lower leg: Secondary | ICD-10-CM | POA: Diagnosis not present

## 2019-05-01 DIAGNOSIS — L97812 Non-pressure chronic ulcer of other part of right lower leg with fat layer exposed: Secondary | ICD-10-CM | POA: Diagnosis not present

## 2019-05-01 DIAGNOSIS — R600 Localized edema: Secondary | ICD-10-CM | POA: Diagnosis not present

## 2019-05-01 DIAGNOSIS — E669 Obesity, unspecified: Secondary | ICD-10-CM | POA: Diagnosis not present

## 2019-05-01 DIAGNOSIS — I872 Venous insufficiency (chronic) (peripheral): Secondary | ICD-10-CM | POA: Diagnosis not present

## 2019-05-01 DIAGNOSIS — E11622 Type 2 diabetes mellitus with other skin ulcer: Secondary | ICD-10-CM | POA: Diagnosis not present

## 2019-05-01 DIAGNOSIS — Z6838 Body mass index (BMI) 38.0-38.9, adult: Secondary | ICD-10-CM | POA: Diagnosis not present

## 2019-05-01 DIAGNOSIS — E785 Hyperlipidemia, unspecified: Secondary | ICD-10-CM | POA: Diagnosis not present

## 2019-05-01 DIAGNOSIS — Z794 Long term (current) use of insulin: Secondary | ICD-10-CM | POA: Diagnosis not present

## 2019-05-01 DIAGNOSIS — I82402 Acute embolism and thrombosis of unspecified deep veins of left lower extremity: Secondary | ICD-10-CM | POA: Diagnosis not present

## 2019-05-01 DIAGNOSIS — Z7901 Long term (current) use of anticoagulants: Secondary | ICD-10-CM | POA: Diagnosis not present

## 2019-05-01 DIAGNOSIS — Z7982 Long term (current) use of aspirin: Secondary | ICD-10-CM | POA: Diagnosis not present

## 2019-05-07 DIAGNOSIS — Z794 Long term (current) use of insulin: Secondary | ICD-10-CM | POA: Diagnosis not present

## 2019-05-07 DIAGNOSIS — E11622 Type 2 diabetes mellitus with other skin ulcer: Secondary | ICD-10-CM | POA: Diagnosis not present

## 2019-05-07 DIAGNOSIS — I872 Venous insufficiency (chronic) (peripheral): Secondary | ICD-10-CM | POA: Diagnosis not present

## 2019-05-07 DIAGNOSIS — E785 Hyperlipidemia, unspecified: Secondary | ICD-10-CM | POA: Diagnosis not present

## 2019-05-07 DIAGNOSIS — E669 Obesity, unspecified: Secondary | ICD-10-CM | POA: Diagnosis not present

## 2019-05-07 DIAGNOSIS — Z6838 Body mass index (BMI) 38.0-38.9, adult: Secondary | ICD-10-CM | POA: Diagnosis not present

## 2019-05-07 DIAGNOSIS — R600 Localized edema: Secondary | ICD-10-CM | POA: Diagnosis not present

## 2019-05-07 DIAGNOSIS — L97812 Non-pressure chronic ulcer of other part of right lower leg with fat layer exposed: Secondary | ICD-10-CM | POA: Diagnosis not present

## 2019-05-07 DIAGNOSIS — I82402 Acute embolism and thrombosis of unspecified deep veins of left lower extremity: Secondary | ICD-10-CM | POA: Diagnosis not present
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
## 2019-07-22 DIAGNOSIS — I872 Venous insufficiency (chronic) (peripheral): Secondary | ICD-10-CM | POA: Diagnosis not present

## 2019-09-11 DIAGNOSIS — R300 Dysuria: Secondary | ICD-10-CM | POA: Diagnosis not present

## 2019-09-11 DIAGNOSIS — Z7982 Long term (current) use of aspirin: Secondary | ICD-10-CM | POA: Diagnosis not present

## 2019-09-11 DIAGNOSIS — R238 Other skin changes: Secondary | ICD-10-CM | POA: Diagnosis not present

## 2019-09-11 DIAGNOSIS — N904 Leukoplakia of vulva: Secondary | ICD-10-CM | POA: Diagnosis not present

## 2019-09-11 DIAGNOSIS — Z79899 Other long term (current) drug therapy: Secondary | ICD-10-CM | POA: Diagnosis not present

## 2019-09-11 DIAGNOSIS — Z91030 Bee allergy status: Secondary | ICD-10-CM | POA: Diagnosis not present

## 2019-10-11 DIAGNOSIS — B373 Candidiasis of vulva and vagina: Secondary | ICD-10-CM | POA: Diagnosis not present

## 2019-10-11 DIAGNOSIS — N921 Excessive and frequent menstruation with irregular cycle: Secondary | ICD-10-CM | POA: Diagnosis not present

## 2019-10-11 DIAGNOSIS — Z01411 Encounter for gynecological examination (general) (routine) with abnormal findings: Secondary | ICD-10-CM | POA: Diagnosis not present

## 2019-10-11 DIAGNOSIS — Z1151 Encounter for screening for human papillomavirus (HPV): Secondary | ICD-10-CM | POA: Diagnosis not present

## 2019-10-11 DIAGNOSIS — R300 Dysuria: Secondary | ICD-10-CM | POA: Diagnosis not present

## 2019-10-11 DIAGNOSIS — N898 Other specified noninflammatory disorders of vagina: Secondary | ICD-10-CM | POA: Diagnosis not present

## 2019-10-14 DIAGNOSIS — Z794 Long term (current) use of insulin: Secondary | ICD-10-CM | POA: Diagnosis not present

## 2019-10-14 DIAGNOSIS — Z760 Encounter for issue of repeat prescription: Secondary | ICD-10-CM | POA: Diagnosis not present

## 2019-10-14 DIAGNOSIS — K219 Gastro-esophageal reflux disease without esophagitis: Secondary | ICD-10-CM | POA: Diagnosis not present

## 2019-10-14 DIAGNOSIS — E1165 Type 2 diabetes mellitus with hyperglycemia: Secondary | ICD-10-CM | POA: Diagnosis not present

## 2019-10-14 DIAGNOSIS — E782 Mixed hyperlipidemia: Secondary | ICD-10-CM | POA: Diagnosis not present

## 2019-10-14 DIAGNOSIS — F418 Other specified anxiety disorders: Secondary | ICD-10-CM | POA: Diagnosis not present

## 2019-10-14 DIAGNOSIS — E559 Vitamin D deficiency, unspecified: Secondary | ICD-10-CM | POA: Diagnosis not present

## 2019-10-14 DIAGNOSIS — I10 Essential (primary) hypertension: Secondary | ICD-10-CM | POA: Diagnosis not present

## 2019-11-27 DIAGNOSIS — Z760 Encounter for issue of repeat prescription: Secondary | ICD-10-CM | POA: Diagnosis not present

## 2019-11-27 DIAGNOSIS — R252 Cramp and spasm: Secondary | ICD-10-CM | POA: Diagnosis not present

## 2019-11-27 DIAGNOSIS — E1165 Type 2 diabetes mellitus with hyperglycemia: Secondary | ICD-10-CM | POA: Diagnosis not present

## 2019-11-27 DIAGNOSIS — E559 Vitamin D deficiency, unspecified: Secondary | ICD-10-CM | POA: Diagnosis not present

## 2019-11-27 DIAGNOSIS — Z794 Long term (current) use of insulin: Secondary | ICD-10-CM | POA: Diagnosis not present

## 2020-01-22 DIAGNOSIS — L97812 Non-pressure chronic ulcer of other part of right lower leg with fat layer exposed: Secondary | ICD-10-CM | POA: Diagnosis not present

## 2020-01-22 DIAGNOSIS — E669 Obesity, unspecified: Secondary | ICD-10-CM | POA: Diagnosis not present

## 2020-01-22 DIAGNOSIS — E11622 Type 2 diabetes mellitus with other skin ulcer: Secondary | ICD-10-CM | POA: Diagnosis not present

## 2020-01-22 DIAGNOSIS — Z86718 Personal history of other venous thrombosis and embolism: Secondary | ICD-10-CM | POA: Diagnosis not present

## 2020-01-22 DIAGNOSIS — Z6838 Body mass index (BMI) 38.0-38.9, adult: Secondary | ICD-10-CM | POA: Diagnosis not present

## 2020-01-22 DIAGNOSIS — I872 Venous insufficiency (chronic) (peripheral): Secondary | ICD-10-CM | POA: Diagnosis not present

## 2020-01-22 DIAGNOSIS — Z794 Long term (current) use of insulin: Secondary | ICD-10-CM | POA: Diagnosis not present

## 2020-01-22 DIAGNOSIS — Z7901 Long term (current) use of anticoagulants: Secondary | ICD-10-CM | POA: Diagnosis not present

## 2020-01-22 DIAGNOSIS — R600 Localized edema: Secondary | ICD-10-CM | POA: Diagnosis not present

## 2020-01-22 DIAGNOSIS — Z7982 Long term (current) use of aspirin: Secondary | ICD-10-CM | POA: Diagnosis not present

## 2020-01-22 DIAGNOSIS — E785 Hyperlipidemia, unspecified: Secondary | ICD-10-CM | POA: Diagnosis not present

## 2020-02-10 DIAGNOSIS — R110 Nausea: Secondary | ICD-10-CM | POA: Diagnosis not present

## 2020-02-10 DIAGNOSIS — B349 Viral infection, unspecified: Secondary | ICD-10-CM | POA: Diagnosis not present

## 2020-02-10 DIAGNOSIS — R0981 Nasal congestion: Secondary | ICD-10-CM | POA: Diagnosis not present

## 2020-02-10 DIAGNOSIS — R509 Fever, unspecified: Secondary | ICD-10-CM | POA: Diagnosis not present

## 2020-02-10 DIAGNOSIS — Z794 Long term (current) use of insulin: Secondary | ICD-10-CM | POA: Diagnosis not present

## 2020-02-10 DIAGNOSIS — R519 Headache, unspecified: Secondary | ICD-10-CM | POA: Diagnosis not present

## 2020-02-10 DIAGNOSIS — R05 Cough: Secondary | ICD-10-CM | POA: Diagnosis not present

## 2020-02-10 DIAGNOSIS — Z7982 Long term (current) use of aspirin: Secondary | ICD-10-CM | POA: Diagnosis not present

## 2020-02-10 DIAGNOSIS — M791 Myalgia, unspecified site: Secondary | ICD-10-CM | POA: Diagnosis not present

## 2020-02-10 DIAGNOSIS — U071 COVID-19: Secondary | ICD-10-CM | POA: Diagnosis not present

## 2020-02-13 DIAGNOSIS — Z91030 Bee allergy status: Secondary | ICD-10-CM | POA: Diagnosis not present

## 2020-02-13 DIAGNOSIS — Z79899 Other long term (current) drug therapy: Secondary | ICD-10-CM | POA: Diagnosis not present

## 2020-02-13 DIAGNOSIS — Z91018 Allergy to other foods: Secondary | ICD-10-CM | POA: Diagnosis not present

## 2020-02-13 DIAGNOSIS — U071 COVID-19: Secondary | ICD-10-CM | POA: Diagnosis not present

## 2020-02-13 DIAGNOSIS — B349 Viral infection, unspecified: Secondary | ICD-10-CM | POA: Diagnosis not present

## 2020-03-03 DIAGNOSIS — E559 Vitamin D deficiency, unspecified: Secondary | ICD-10-CM | POA: Diagnosis not present

## 2020-03-03 DIAGNOSIS — I10 Essential (primary) hypertension: Secondary | ICD-10-CM | POA: Diagnosis not present

## 2020-03-03 DIAGNOSIS — E782 Mixed hyperlipidemia: Secondary | ICD-10-CM | POA: Diagnosis not present

## 2020-03-03 DIAGNOSIS — Z794 Long term (current) use of insulin: Secondary | ICD-10-CM | POA: Diagnosis not present

## 2020-03-03 DIAGNOSIS — E1165 Type 2 diabetes mellitus with hyperglycemia: Secondary | ICD-10-CM | POA: Diagnosis not present

## 2020-03-03 DIAGNOSIS — F4321 Adjustment disorder with depressed mood: Secondary | ICD-10-CM | POA: Diagnosis not present

## 2020-03-03 DIAGNOSIS — L02421 Furuncle of right axilla: Secondary | ICD-10-CM | POA: Diagnosis not present

## 2020-03-04 DIAGNOSIS — I872 Venous insufficiency (chronic) (peripheral): Secondary | ICD-10-CM | POA: Diagnosis not present

## 2020-03-04 DIAGNOSIS — L97812 Non-pressure chronic ulcer of other part of right lower leg with fat layer exposed: Secondary | ICD-10-CM | POA: Diagnosis not present

## 2020-03-04 DIAGNOSIS — Z7982 Long term (current) use of aspirin: Secondary | ICD-10-CM | POA: Diagnosis not present

## 2020-03-04 DIAGNOSIS — Z794 Long term (current) use of insulin: Secondary | ICD-10-CM | POA: Diagnosis not present

## 2020-03-04 DIAGNOSIS — Z7901 Long term (current) use of anticoagulants: Secondary | ICD-10-CM | POA: Diagnosis not present

## 2020-03-04 DIAGNOSIS — E11622 Type 2 diabetes mellitus with other skin ulcer: Secondary | ICD-10-CM | POA: Diagnosis not present

## 2020-03-04 DIAGNOSIS — R600 Localized edema: Secondary | ICD-10-CM | POA: Diagnosis not present

## 2020-03-04 DIAGNOSIS — E669 Obesity, unspecified: Secondary | ICD-10-CM | POA: Diagnosis not present

## 2020-03-05 DIAGNOSIS — L97919 Non-pressure chronic ulcer of unspecified part of right lower leg with unspecified severity: Secondary | ICD-10-CM | POA: Diagnosis not present

## 2020-03-30 DIAGNOSIS — L97812 Non-pressure chronic ulcer of other part of right lower leg with fat layer exposed: Secondary | ICD-10-CM | POA: Diagnosis not present

## 2020-03-30 DIAGNOSIS — R600 Localized edema: Secondary | ICD-10-CM | POA: Diagnosis not present

## 2020-03-30 DIAGNOSIS — E669 Obesity, unspecified: Secondary | ICD-10-CM | POA: Diagnosis not present

## 2020-03-30 DIAGNOSIS — E11622 Type 2 diabetes mellitus with other skin ulcer: Secondary | ICD-10-CM | POA: Diagnosis not present

## 2020-03-30 DIAGNOSIS — Z7982 Long term (current) use of aspirin: Secondary | ICD-10-CM | POA: Diagnosis not present

## 2020-03-30 DIAGNOSIS — Z7901 Long term (current) use of anticoagulants: Secondary | ICD-10-CM | POA: Diagnosis not present

## 2020-03-30 DIAGNOSIS — Z794 Long term (current) use of insulin: Secondary | ICD-10-CM | POA: Diagnosis not present

## 2020-03-30 DIAGNOSIS — I872 Venous insufficiency (chronic) (peripheral): Secondary | ICD-10-CM | POA: Diagnosis not present

## 2020-04-13 DIAGNOSIS — R600 Localized edema: Secondary | ICD-10-CM | POA: Diagnosis not present

## 2020-04-13 DIAGNOSIS — E11622 Type 2 diabetes mellitus with other skin ulcer: Secondary | ICD-10-CM | POA: Diagnosis not present

## 2020-04-13 DIAGNOSIS — Z794 Long term (current) use of insulin: Secondary | ICD-10-CM | POA: Diagnosis not present

## 2020-04-13 DIAGNOSIS — Z7982 Long term (current) use of aspirin: Secondary | ICD-10-CM | POA: Diagnosis not present

## 2020-04-13 DIAGNOSIS — L97812 Non-pressure chronic ulcer of other part of right lower leg with fat layer exposed: Secondary | ICD-10-CM | POA: Diagnosis not present

## 2020-04-13 DIAGNOSIS — E669 Obesity, unspecified: Secondary | ICD-10-CM | POA: Diagnosis not present

## 2020-04-13 DIAGNOSIS — Z7901 Long term (current) use of anticoagulants: Secondary | ICD-10-CM | POA: Diagnosis not present

## 2020-04-13 DIAGNOSIS — I872 Venous insufficiency (chronic) (peripheral): Secondary | ICD-10-CM | POA: Diagnosis not present

## 2020-04-14 DIAGNOSIS — L97919 Non-pressure chronic ulcer of unspecified part of right lower leg with unspecified severity: Secondary | ICD-10-CM | POA: Diagnosis not present

## 2020-04-14 DIAGNOSIS — E785 Hyperlipidemia, unspecified: Secondary | ICD-10-CM | POA: Diagnosis not present

## 2020-04-14 DIAGNOSIS — I83009 Varicose veins of unspecified lower extremity with ulcer of unspecified site: Secondary | ICD-10-CM | POA: Diagnosis not present

## 2020-04-14 DIAGNOSIS — I82419 Acute embolism and thrombosis of unspecified femoral vein: Secondary | ICD-10-CM | POA: Diagnosis not present

## 2020-04-14 DIAGNOSIS — G4733 Obstructive sleep apnea (adult) (pediatric): Secondary | ICD-10-CM | POA: Diagnosis not present

## 2020-04-27 DIAGNOSIS — I83013 Varicose veins of right lower extremity with ulcer of ankle: Secondary | ICD-10-CM | POA: Diagnosis not present

## 2020-04-27 DIAGNOSIS — I83002 Varicose veins of unspecified lower extremity with ulcer of calf: Secondary | ICD-10-CM | POA: Diagnosis not present

## 2020-05-20 DIAGNOSIS — Z91030 Bee allergy status: Secondary | ICD-10-CM | POA: Diagnosis not present

## 2020-05-20 DIAGNOSIS — Z7982 Long term (current) use of aspirin: Secondary | ICD-10-CM | POA: Diagnosis not present

## 2020-05-20 DIAGNOSIS — I83012 Varicose veins of right lower extremity with ulcer of calf: Secondary | ICD-10-CM | POA: Diagnosis not present

## 2020-05-20 DIAGNOSIS — Z794 Long term (current) use of insulin: Secondary | ICD-10-CM | POA: Diagnosis not present

## 2020-05-20 DIAGNOSIS — E119 Type 2 diabetes mellitus without complications: Secondary | ICD-10-CM | POA: Diagnosis not present

## 2020-05-20 DIAGNOSIS — L97211 Non-pressure chronic ulcer of right calf limited to breakdown of skin: Secondary | ICD-10-CM | POA: Diagnosis not present

## 2020-05-20 DIAGNOSIS — Z7901 Long term (current) use of anticoagulants: Secondary | ICD-10-CM | POA: Diagnosis not present

## 2020-05-20 DIAGNOSIS — E785 Hyperlipidemia, unspecified: Secondary | ICD-10-CM | POA: Diagnosis not present

## 2020-05-20 DIAGNOSIS — Z20822 Contact with and (suspected) exposure to covid-19: Secondary | ICD-10-CM | POA: Diagnosis not present

## 2020-05-20 DIAGNOSIS — L97919 Non-pressure chronic ulcer of unspecified part of right lower leg with unspecified severity: Secondary | ICD-10-CM | POA: Diagnosis not present

## 2020-05-26 DIAGNOSIS — E119 Type 2 diabetes mellitus without complications: Secondary | ICD-10-CM | POA: Diagnosis not present

## 2020-05-26 DIAGNOSIS — Z794 Long term (current) use of insulin: Secondary | ICD-10-CM | POA: Diagnosis not present

## 2020-05-26 DIAGNOSIS — I83009 Varicose veins of unspecified lower extremity with ulcer of unspecified site: Secondary | ICD-10-CM | POA: Diagnosis not present

## 2020-05-26 DIAGNOSIS — Z7901 Long term (current) use of anticoagulants: Secondary | ICD-10-CM | POA: Diagnosis not present

## 2020-05-26 DIAGNOSIS — L97211 Non-pressure chronic ulcer of right calf limited to breakdown of skin: Secondary | ICD-10-CM | POA: Diagnosis not present

## 2020-05-26 DIAGNOSIS — Z20822 Contact with and (suspected) exposure to covid-19: Secondary | ICD-10-CM | POA: Diagnosis not present

## 2020-05-26 DIAGNOSIS — E785 Hyperlipidemia, unspecified: Secondary | ICD-10-CM | POA: Diagnosis not present

## 2020-05-26 DIAGNOSIS — Z91030 Bee allergy status: Secondary | ICD-10-CM | POA: Diagnosis not present

## 2020-05-26 DIAGNOSIS — I83012 Varicose veins of right lower extremity with ulcer of calf: Secondary | ICD-10-CM | POA: Diagnosis not present

## 2020-05-26 DIAGNOSIS — Z7982 Long term (current) use of aspirin: Secondary | ICD-10-CM | POA: Diagnosis not present

## 2020-05-26 DIAGNOSIS — I87319 Chronic venous hypertension (idiopathic) with ulcer of unspecified lower extremity: Secondary | ICD-10-CM | POA: Diagnosis not present

## 2020-06-11 DIAGNOSIS — I83009 Varicose veins of unspecified lower extremity with ulcer of unspecified site: Secondary | ICD-10-CM | POA: Diagnosis not present

## 2020-06-22 DIAGNOSIS — L97919 Non-pressure chronic ulcer of unspecified part of right lower leg with unspecified severity: Secondary | ICD-10-CM | POA: Diagnosis not present

## 2020-06-22 DIAGNOSIS — I872 Venous insufficiency (chronic) (peripheral): Secondary | ICD-10-CM | POA: Diagnosis not present

## 2020-06-22 DIAGNOSIS — Z7901 Long term (current) use of anticoagulants: Secondary | ICD-10-CM | POA: Diagnosis not present

## 2020-06-22 DIAGNOSIS — Z794 Long term (current) use of insulin: Secondary | ICD-10-CM | POA: Diagnosis not present

## 2020-06-22 DIAGNOSIS — E669 Obesity, unspecified: Secondary | ICD-10-CM | POA: Diagnosis not present

## 2020-06-22 DIAGNOSIS — R600 Localized edema: Secondary | ICD-10-CM | POA: Diagnosis not present

## 2020-06-22 DIAGNOSIS — L97812 Non-pressure chronic ulcer of other part of right lower leg with fat layer exposed: Secondary | ICD-10-CM | POA: Diagnosis not present

## 2020-06-22 DIAGNOSIS — E11622 Type 2 diabetes mellitus with other skin ulcer: Secondary | ICD-10-CM | POA: Diagnosis not present

## 2020-06-22 DIAGNOSIS — Z7982 Long term (current) use of aspirin: Secondary | ICD-10-CM | POA: Diagnosis not present

## 2020-07-02 DIAGNOSIS — E669 Obesity, unspecified: Secondary | ICD-10-CM | POA: Diagnosis not present

## 2020-07-02 DIAGNOSIS — R600 Localized edema: Secondary | ICD-10-CM | POA: Diagnosis not present

## 2020-07-02 DIAGNOSIS — I872 Venous insufficiency (chronic) (peripheral): Secondary | ICD-10-CM | POA: Diagnosis not present

## 2020-07-02 DIAGNOSIS — Z86718 Personal history of other venous thrombosis and embolism: Secondary | ICD-10-CM | POA: Diagnosis not present

## 2020-07-02 DIAGNOSIS — Z7901 Long term (current) use of anticoagulants: Secondary | ICD-10-CM | POA: Diagnosis not present

## 2020-07-02 DIAGNOSIS — E785 Hyperlipidemia, unspecified: Secondary | ICD-10-CM | POA: Diagnosis not present

## 2020-07-02 DIAGNOSIS — L97919 Non-pressure chronic ulcer of unspecified part of right lower leg with unspecified severity: Secondary | ICD-10-CM | POA: Diagnosis not present

## 2020-07-02 DIAGNOSIS — L97812 Non-pressure chronic ulcer of other part of right lower leg with fat layer exposed: Secondary | ICD-10-CM | POA: Diagnosis not present

## 2020-07-02 DIAGNOSIS — Z7982 Long term (current) use of aspirin: Secondary | ICD-10-CM | POA: Diagnosis not present

## 2020-07-02 DIAGNOSIS — Z6838 Body mass index (BMI) 38.0-38.9, adult: Secondary | ICD-10-CM | POA: Diagnosis not present

## 2020-07-02 DIAGNOSIS — Z794 Long term (current) use of insulin: Secondary | ICD-10-CM | POA: Diagnosis not present

## 2020-07-02 DIAGNOSIS — E11622 Type 2 diabetes mellitus with other skin ulcer: Secondary | ICD-10-CM | POA: Diagnosis not present

## 2020-07-17 DIAGNOSIS — E11622 Type 2 diabetes mellitus with other skin ulcer: Secondary | ICD-10-CM | POA: Diagnosis not present

## 2020-07-17 DIAGNOSIS — E669 Obesity, unspecified: Secondary | ICD-10-CM | POA: Diagnosis not present

## 2020-07-17 DIAGNOSIS — L97919 Non-pressure chronic ulcer of unspecified part of right lower leg with unspecified severity: Secondary | ICD-10-CM | POA: Diagnosis not present

## 2020-07-17 DIAGNOSIS — Z794 Long term (current) use of insulin: Secondary | ICD-10-CM | POA: Diagnosis not present

## 2020-07-17 DIAGNOSIS — Z7982 Long term (current) use of aspirin: Secondary | ICD-10-CM | POA: Diagnosis not present

## 2020-07-17 DIAGNOSIS — R600 Localized edema: Secondary | ICD-10-CM | POA: Diagnosis not present

## 2020-07-17 DIAGNOSIS — L97812 Non-pressure chronic ulcer of other part of right lower leg with fat layer exposed: Secondary | ICD-10-CM | POA: Diagnosis not present

## 2020-07-17 DIAGNOSIS — Z7901 Long term (current) use of anticoagulants: Secondary | ICD-10-CM | POA: Diagnosis not present

## 2020-07-17 DIAGNOSIS — I872 Venous insufficiency (chronic) (peripheral): Secondary | ICD-10-CM | POA: Diagnosis not present

## 2020-07-30 DIAGNOSIS — I83009 Varicose veins of unspecified lower extremity with ulcer of unspecified site: Secondary | ICD-10-CM | POA: Diagnosis not present

## 2020-08-05 DIAGNOSIS — Z794 Long term (current) use of insulin: Secondary | ICD-10-CM | POA: Diagnosis not present

## 2020-08-05 DIAGNOSIS — Z7982 Long term (current) use of aspirin: Secondary | ICD-10-CM | POA: Diagnosis not present

## 2020-08-05 DIAGNOSIS — R600 Localized edema: Secondary | ICD-10-CM | POA: Diagnosis not present

## 2020-08-05 DIAGNOSIS — L97812 Non-pressure chronic ulcer of other part of right lower leg with fat layer exposed: Secondary | ICD-10-CM | POA: Diagnosis not present

## 2020-08-05 DIAGNOSIS — I872 Venous insufficiency (chronic) (peripheral): Secondary | ICD-10-CM | POA: Diagnosis not present

## 2020-08-05 DIAGNOSIS — L97919 Non-pressure chronic ulcer of unspecified part of right lower leg with unspecified severity: Secondary | ICD-10-CM | POA: Diagnosis not present

## 2020-08-05 DIAGNOSIS — E11622 Type 2 diabetes mellitus with other skin ulcer: Secondary | ICD-10-CM | POA: Diagnosis not present

## 2020-08-05 DIAGNOSIS — Z7901 Long term (current) use of anticoagulants: Secondary | ICD-10-CM | POA: Diagnosis not present

## 2020-08-05 DIAGNOSIS — E669 Obesity, unspecified: Secondary | ICD-10-CM | POA: Diagnosis not present

## 2020-08-24 DIAGNOSIS — I872 Venous insufficiency (chronic) (peripheral): Secondary | ICD-10-CM | POA: Diagnosis not present

## 2020-08-24 DIAGNOSIS — L97812 Non-pressure chronic ulcer of other part of right lower leg with fat layer exposed: Secondary | ICD-10-CM | POA: Diagnosis not present

## 2020-08-24 DIAGNOSIS — Z6838 Body mass index (BMI) 38.0-38.9, adult: Secondary | ICD-10-CM | POA: Diagnosis not present

## 2020-08-24 DIAGNOSIS — R600 Localized edema: Secondary | ICD-10-CM | POA: Diagnosis not present

## 2020-08-24 DIAGNOSIS — Z7901 Long term (current) use of anticoagulants: Secondary | ICD-10-CM | POA: Diagnosis not present

## 2020-08-24 DIAGNOSIS — Z86718 Personal history of other venous thrombosis and embolism: Secondary | ICD-10-CM | POA: Diagnosis not present

## 2020-08-24 DIAGNOSIS — Z794 Long term (current) use of insulin: Secondary | ICD-10-CM | POA: Diagnosis not present

## 2020-08-24 DIAGNOSIS — E785 Hyperlipidemia, unspecified: Secondary | ICD-10-CM | POA: Diagnosis not present

## 2020-08-24 DIAGNOSIS — Z7982 Long term (current) use of aspirin: Secondary | ICD-10-CM | POA: Diagnosis not present

## 2020-08-24 DIAGNOSIS — E669 Obesity, unspecified: Secondary | ICD-10-CM | POA: Diagnosis not present

## 2020-08-24 DIAGNOSIS — E11622 Type 2 diabetes mellitus with other skin ulcer: Secondary | ICD-10-CM | POA: Diagnosis not present

## 2020-08-24 DIAGNOSIS — L97919 Non-pressure chronic ulcer of unspecified part of right lower leg with unspecified severity: Secondary | ICD-10-CM | POA: Diagnosis not present

## 2020-09-21 DIAGNOSIS — E669 Obesity, unspecified: Secondary | ICD-10-CM | POA: Diagnosis not present

## 2020-09-21 DIAGNOSIS — L97812 Non-pressure chronic ulcer of other part of right lower leg with fat layer exposed: Secondary | ICD-10-CM | POA: Diagnosis not present

## 2020-09-21 DIAGNOSIS — Z794 Long term (current) use of insulin: Secondary | ICD-10-CM | POA: Diagnosis not present

## 2020-09-21 DIAGNOSIS — Z7901 Long term (current) use of anticoagulants: Secondary | ICD-10-CM | POA: Diagnosis not present

## 2020-09-21 DIAGNOSIS — Z7982 Long term (current) use of aspirin: Secondary | ICD-10-CM | POA: Diagnosis not present

## 2020-09-21 DIAGNOSIS — R600 Localized edema: Secondary | ICD-10-CM | POA: Diagnosis not present

## 2020-09-21 DIAGNOSIS — L97919 Non-pressure chronic ulcer of unspecified part of right lower leg with unspecified severity: Secondary | ICD-10-CM | POA: Diagnosis not present

## 2020-09-21 DIAGNOSIS — E11622 Type 2 diabetes mellitus with other skin ulcer: Secondary | ICD-10-CM | POA: Diagnosis not present

## 2020-09-22 DIAGNOSIS — E11622 Type 2 diabetes mellitus with other skin ulcer: Secondary | ICD-10-CM | POA: Diagnosis not present

## 2020-10-09 DIAGNOSIS — E11622 Type 2 diabetes mellitus with other skin ulcer: Secondary | ICD-10-CM | POA: Diagnosis not present

## 2020-10-09 DIAGNOSIS — E669 Obesity, unspecified: Secondary | ICD-10-CM | POA: Diagnosis not present

## 2020-10-09 DIAGNOSIS — Z794 Long term (current) use of insulin: Secondary | ICD-10-CM | POA: Diagnosis not present

## 2020-10-09 DIAGNOSIS — L97812 Non-pressure chronic ulcer of other part of right lower leg with fat layer exposed: Secondary | ICD-10-CM | POA: Diagnosis not present

## 2020-10-09 DIAGNOSIS — R600 Localized edema: Secondary | ICD-10-CM | POA: Diagnosis not present

## 2020-10-09 DIAGNOSIS — L97919 Non-pressure chronic ulcer of unspecified part of right lower leg with unspecified severity: Secondary | ICD-10-CM | POA: Diagnosis not present

## 2020-10-09 DIAGNOSIS — Z7982 Long term (current) use of aspirin: Secondary | ICD-10-CM | POA: Diagnosis not present

## 2020-10-09 DIAGNOSIS — Z7901 Long term (current) use of anticoagulants: Secondary | ICD-10-CM | POA: Diagnosis not present

## 2020-10-09 DIAGNOSIS — I872 Venous insufficiency (chronic) (peripheral): Secondary | ICD-10-CM | POA: Diagnosis not present

## 2020-11-05 DIAGNOSIS — Z86718 Personal history of other venous thrombosis and embolism: Secondary | ICD-10-CM | POA: Diagnosis not present

## 2020-11-05 DIAGNOSIS — L97812 Non-pressure chronic ulcer of other part of right lower leg with fat layer exposed: Secondary | ICD-10-CM | POA: Diagnosis not present

## 2020-11-05 DIAGNOSIS — E11622 Type 2 diabetes mellitus with other skin ulcer: Secondary | ICD-10-CM | POA: Diagnosis not present

## 2020-11-05 DIAGNOSIS — Z794 Long term (current) use of insulin: Secondary | ICD-10-CM | POA: Diagnosis not present

## 2020-11-05 DIAGNOSIS — Z6838 Body mass index (BMI) 38.0-38.9, adult: Secondary | ICD-10-CM | POA: Diagnosis not present

## 2020-11-05 DIAGNOSIS — E669 Obesity, unspecified: Secondary | ICD-10-CM | POA: Diagnosis not present

## 2020-11-05 DIAGNOSIS — E785 Hyperlipidemia, unspecified: Secondary | ICD-10-CM | POA: Diagnosis not present

## 2020-11-05 DIAGNOSIS — R600 Localized edema: Secondary | ICD-10-CM | POA: Diagnosis not present

## 2020-11-05 DIAGNOSIS — I872 Venous insufficiency (chronic) (peripheral): Secondary | ICD-10-CM | POA: Diagnosis not present

## 2020-11-12 DIAGNOSIS — Z6838 Body mass index (BMI) 38.0-38.9, adult: Secondary | ICD-10-CM | POA: Diagnosis not present

## 2020-11-12 DIAGNOSIS — R600 Localized edema: Secondary | ICD-10-CM | POA: Diagnosis not present

## 2020-11-12 DIAGNOSIS — E11622 Type 2 diabetes mellitus with other skin ulcer: Secondary | ICD-10-CM | POA: Diagnosis not present

## 2020-11-12 DIAGNOSIS — L97919 Non-pressure chronic ulcer of unspecified part of right lower leg with unspecified severity: Secondary | ICD-10-CM | POA: Diagnosis not present

## 2020-11-12 DIAGNOSIS — I872 Venous insufficiency (chronic) (peripheral): Secondary | ICD-10-CM | POA: Diagnosis not present

## 2020-11-12 DIAGNOSIS — E669 Obesity, unspecified: Secondary | ICD-10-CM | POA: Diagnosis not present

## 2020-11-12 DIAGNOSIS — E785 Hyperlipidemia, unspecified: Secondary | ICD-10-CM | POA: Diagnosis not present

## 2020-11-12 DIAGNOSIS — Z794 Long term (current) use of insulin: Secondary | ICD-10-CM | POA: Diagnosis not present

## 2020-11-12 DIAGNOSIS — L97812 Non-pressure chronic ulcer of other part of right lower leg with fat layer exposed: Secondary | ICD-10-CM | POA: Diagnosis not present

## 2020-11-12 DIAGNOSIS — Z86718 Personal history of other venous thrombosis and embolism: Secondary | ICD-10-CM | POA: Diagnosis not present

## 2020-11-18 DIAGNOSIS — I83009 Varicose veins of unspecified lower extremity with ulcer of unspecified site: Secondary | ICD-10-CM | POA: Diagnosis not present

## 2020-11-18 DIAGNOSIS — E119 Type 2 diabetes mellitus without complications: Secondary | ICD-10-CM | POA: Diagnosis not present

## 2020-11-18 DIAGNOSIS — Z7689 Persons encountering health services in other specified circumstances: Secondary | ICD-10-CM | POA: Diagnosis not present

## 2020-11-18 DIAGNOSIS — I82419 Acute embolism and thrombosis of unspecified femoral vein: Secondary | ICD-10-CM | POA: Diagnosis not present

## 2020-11-18 DIAGNOSIS — E785 Hyperlipidemia, unspecified: Secondary | ICD-10-CM | POA: Diagnosis not present

## 2020-11-18 DIAGNOSIS — E1169 Type 2 diabetes mellitus with other specified complication: Secondary | ICD-10-CM | POA: Diagnosis not present

## 2020-11-18 DIAGNOSIS — I824Y9 Acute embolism and thrombosis of unspecified deep veins of unspecified proximal lower extremity: Secondary | ICD-10-CM | POA: Diagnosis not present

## 2020-11-18 DIAGNOSIS — Z0189 Encounter for other specified special examinations: Secondary | ICD-10-CM | POA: Diagnosis not present

## 2020-11-18 DIAGNOSIS — Z794 Long term (current) use of insulin: Secondary | ICD-10-CM | POA: Diagnosis not present

## 2020-11-19 DIAGNOSIS — Z7982 Long term (current) use of aspirin: Secondary | ICD-10-CM | POA: Diagnosis not present

## 2020-11-19 DIAGNOSIS — E785 Hyperlipidemia, unspecified: Secondary | ICD-10-CM | POA: Diagnosis not present

## 2020-11-19 DIAGNOSIS — I872 Venous insufficiency (chronic) (peripheral): Secondary | ICD-10-CM | POA: Diagnosis not present

## 2020-11-19 DIAGNOSIS — Z6838 Body mass index (BMI) 38.0-38.9, adult: Secondary | ICD-10-CM | POA: Diagnosis not present

## 2020-11-19 DIAGNOSIS — Z794 Long term (current) use of insulin: Secondary | ICD-10-CM | POA: Diagnosis not present

## 2020-11-19 DIAGNOSIS — E11622 Type 2 diabetes mellitus with other skin ulcer: Secondary | ICD-10-CM | POA: Diagnosis not present

## 2020-11-19 DIAGNOSIS — Z7901 Long term (current) use of anticoagulants: Secondary | ICD-10-CM | POA: Diagnosis not present

## 2020-11-19 DIAGNOSIS — L97812 Non-pressure chronic ulcer of other part of right lower leg with fat layer exposed: Secondary | ICD-10-CM | POA: Diagnosis not present

## 2020-11-19 DIAGNOSIS — E669 Obesity, unspecified: Secondary | ICD-10-CM | POA: Diagnosis not present

## 2020-11-19 DIAGNOSIS — Z86718 Personal history of other venous thrombosis and embolism: Secondary | ICD-10-CM | POA: Diagnosis not present

## 2020-11-19 DIAGNOSIS — R600 Localized edema: Secondary | ICD-10-CM | POA: Diagnosis not present

## 2020-11-26 DIAGNOSIS — I872 Venous insufficiency (chronic) (peripheral): Secondary | ICD-10-CM | POA: Diagnosis not present

## 2020-11-26 DIAGNOSIS — Z7982 Long term (current) use of aspirin: Secondary | ICD-10-CM | POA: Diagnosis not present

## 2020-11-26 DIAGNOSIS — Z6838 Body mass index (BMI) 38.0-38.9, adult: Secondary | ICD-10-CM | POA: Diagnosis not present

## 2020-11-26 DIAGNOSIS — E11622 Type 2 diabetes mellitus with other skin ulcer: Secondary | ICD-10-CM | POA: Diagnosis not present

## 2020-11-26 DIAGNOSIS — E669 Obesity, unspecified: Secondary | ICD-10-CM | POA: Diagnosis not present

## 2020-11-26 DIAGNOSIS — L97919 Non-pressure chronic ulcer of unspecified part of right lower leg with unspecified severity: Secondary | ICD-10-CM | POA: Diagnosis not present

## 2020-11-26 DIAGNOSIS — E785 Hyperlipidemia, unspecified: Secondary | ICD-10-CM | POA: Diagnosis not present

## 2020-11-26 DIAGNOSIS — Z86718 Personal history of other venous thrombosis and embolism: Secondary | ICD-10-CM | POA: Diagnosis not present

## 2020-11-26 DIAGNOSIS — Z794 Long term (current) use of insulin: Secondary | ICD-10-CM | POA: Diagnosis not present

## 2020-11-26 DIAGNOSIS — L97812 Non-pressure chronic ulcer of other part of right lower leg with fat layer exposed: Secondary | ICD-10-CM | POA: Diagnosis not present

## 2020-11-26 DIAGNOSIS — R600 Localized edema: Secondary | ICD-10-CM | POA: Diagnosis not present

## 2020-11-26 DIAGNOSIS — Z7901 Long term (current) use of anticoagulants: Secondary | ICD-10-CM | POA: Diagnosis not present

## 2020-12-03 DIAGNOSIS — Z7982 Long term (current) use of aspirin: Secondary | ICD-10-CM | POA: Diagnosis not present

## 2020-12-03 DIAGNOSIS — Z794 Long term (current) use of insulin: Secondary | ICD-10-CM | POA: Diagnosis not present

## 2020-12-03 DIAGNOSIS — L97812 Non-pressure chronic ulcer of other part of right lower leg with fat layer exposed: Secondary | ICD-10-CM | POA: Diagnosis not present

## 2020-12-03 DIAGNOSIS — Z7984 Long term (current) use of oral hypoglycemic drugs: Secondary | ICD-10-CM | POA: Diagnosis not present

## 2020-12-03 DIAGNOSIS — Z7901 Long term (current) use of anticoagulants: Secondary | ICD-10-CM | POA: Diagnosis not present

## 2020-12-03 DIAGNOSIS — E669 Obesity, unspecified: Secondary | ICD-10-CM | POA: Diagnosis not present

## 2020-12-03 DIAGNOSIS — E785 Hyperlipidemia, unspecified: Secondary | ICD-10-CM | POA: Diagnosis not present

## 2020-12-03 DIAGNOSIS — Z6838 Body mass index (BMI) 38.0-38.9, adult: Secondary | ICD-10-CM | POA: Diagnosis not present

## 2020-12-03 DIAGNOSIS — E11622 Type 2 diabetes mellitus with other skin ulcer: Secondary | ICD-10-CM | POA: Diagnosis not present

## 2020-12-03 DIAGNOSIS — Z86718 Personal history of other venous thrombosis and embolism: Secondary | ICD-10-CM | POA: Diagnosis not present

## 2020-12-03 DIAGNOSIS — I872 Venous insufficiency (chronic) (peripheral): Secondary | ICD-10-CM | POA: Diagnosis not present

## 2020-12-03 DIAGNOSIS — R600 Localized edema: Secondary | ICD-10-CM | POA: Diagnosis not present

## 2020-12-10 DIAGNOSIS — Z794 Long term (current) use of insulin: Secondary | ICD-10-CM | POA: Diagnosis not present

## 2020-12-10 DIAGNOSIS — E11622 Type 2 diabetes mellitus with other skin ulcer: Secondary | ICD-10-CM | POA: Diagnosis not present

## 2020-12-10 DIAGNOSIS — Z6838 Body mass index (BMI) 38.0-38.9, adult: Secondary | ICD-10-CM | POA: Diagnosis not present

## 2020-12-10 DIAGNOSIS — Z7901 Long term (current) use of anticoagulants: Secondary | ICD-10-CM | POA: Diagnosis not present

## 2020-12-10 DIAGNOSIS — I872 Venous insufficiency (chronic) (peripheral): Secondary | ICD-10-CM | POA: Diagnosis not present

## 2020-12-10 DIAGNOSIS — Z7982 Long term (current) use of aspirin: Secondary | ICD-10-CM | POA: Diagnosis not present

## 2020-12-10 DIAGNOSIS — E785 Hyperlipidemia, unspecified: Secondary | ICD-10-CM | POA: Diagnosis not present

## 2020-12-10 DIAGNOSIS — Z7984 Long term (current) use of oral hypoglycemic drugs: Secondary | ICD-10-CM | POA: Diagnosis not present

## 2020-12-10 DIAGNOSIS — Z86718 Personal history of other venous thrombosis and embolism: Secondary | ICD-10-CM | POA: Diagnosis not present

## 2020-12-10 DIAGNOSIS — L97812 Non-pressure chronic ulcer of other part of right lower leg with fat layer exposed: Secondary | ICD-10-CM | POA: Diagnosis not present

## 2020-12-10 DIAGNOSIS — E669 Obesity, unspecified: Secondary | ICD-10-CM | POA: Diagnosis not present

## 2020-12-10 DIAGNOSIS — R600 Localized edema: Secondary | ICD-10-CM | POA: Diagnosis not present

## 2020-12-17 DIAGNOSIS — I872 Venous insufficiency (chronic) (peripheral): Secondary | ICD-10-CM | POA: Diagnosis not present

## 2020-12-17 DIAGNOSIS — Z794 Long term (current) use of insulin: Secondary | ICD-10-CM | POA: Diagnosis not present

## 2020-12-17 DIAGNOSIS — E669 Obesity, unspecified: Secondary | ICD-10-CM | POA: Diagnosis not present

## 2020-12-17 DIAGNOSIS — R600 Localized edema: Secondary | ICD-10-CM | POA: Diagnosis not present

## 2020-12-17 DIAGNOSIS — E785 Hyperlipidemia, unspecified: Secondary | ICD-10-CM | POA: Diagnosis not present

## 2020-12-17 DIAGNOSIS — Z6838 Body mass index (BMI) 38.0-38.9, adult: Secondary | ICD-10-CM | POA: Diagnosis not present

## 2020-12-17 DIAGNOSIS — L97812 Non-pressure chronic ulcer of other part of right lower leg with fat layer exposed: Secondary | ICD-10-CM | POA: Diagnosis not present

## 2020-12-17 DIAGNOSIS — E11622 Type 2 diabetes mellitus with other skin ulcer: Secondary | ICD-10-CM | POA: Diagnosis not present

## 2020-12-17 DIAGNOSIS — Z86718 Personal history of other venous thrombosis and embolism: Secondary | ICD-10-CM | POA: Diagnosis not present

## 2020-12-21 DIAGNOSIS — I10 Essential (primary) hypertension: Secondary | ICD-10-CM | POA: Diagnosis not present

## 2020-12-21 DIAGNOSIS — R0789 Other chest pain: Secondary | ICD-10-CM | POA: Diagnosis not present

## 2020-12-21 DIAGNOSIS — R079 Chest pain, unspecified: Secondary | ICD-10-CM | POA: Diagnosis not present

## 2020-12-24 DIAGNOSIS — R600 Localized edema: Secondary | ICD-10-CM | POA: Diagnosis not present

## 2020-12-24 DIAGNOSIS — L97812 Non-pressure chronic ulcer of other part of right lower leg with fat layer exposed: Secondary | ICD-10-CM | POA: Diagnosis not present

## 2020-12-24 DIAGNOSIS — Z794 Long term (current) use of insulin: Secondary | ICD-10-CM | POA: Diagnosis not present

## 2020-12-24 DIAGNOSIS — E11622 Type 2 diabetes mellitus with other skin ulcer: Secondary | ICD-10-CM | POA: Diagnosis not present

## 2020-12-24 DIAGNOSIS — E669 Obesity, unspecified: Secondary | ICD-10-CM | POA: Diagnosis not present

## 2020-12-24 DIAGNOSIS — Z6838 Body mass index (BMI) 38.0-38.9, adult: Secondary | ICD-10-CM | POA: Diagnosis not present

## 2020-12-24 DIAGNOSIS — Z86718 Personal history of other venous thrombosis and embolism: Secondary | ICD-10-CM | POA: Diagnosis not present

## 2020-12-24 DIAGNOSIS — E785 Hyperlipidemia, unspecified: Secondary | ICD-10-CM | POA: Diagnosis not present

## 2020-12-24 DIAGNOSIS — I872 Venous insufficiency (chronic) (peripheral): Secondary | ICD-10-CM | POA: Diagnosis not present

## 2020-12-28 DIAGNOSIS — E119 Type 2 diabetes mellitus without complications: Secondary | ICD-10-CM | POA: Diagnosis not present

## 2020-12-28 DIAGNOSIS — Z794 Long term (current) use of insulin: Secondary | ICD-10-CM | POA: Diagnosis not present

## 2020-12-31 DIAGNOSIS — Z86718 Personal history of other venous thrombosis and embolism: Secondary | ICD-10-CM | POA: Diagnosis not present

## 2020-12-31 DIAGNOSIS — E11622 Type 2 diabetes mellitus with other skin ulcer: Secondary | ICD-10-CM | POA: Diagnosis not present

## 2020-12-31 DIAGNOSIS — Z794 Long term (current) use of insulin: Secondary | ICD-10-CM | POA: Diagnosis not present

## 2020-12-31 DIAGNOSIS — E785 Hyperlipidemia, unspecified: Secondary | ICD-10-CM | POA: Diagnosis not present

## 2020-12-31 DIAGNOSIS — L97812 Non-pressure chronic ulcer of other part of right lower leg with fat layer exposed: Secondary | ICD-10-CM | POA: Diagnosis not present

## 2020-12-31 DIAGNOSIS — Z6838 Body mass index (BMI) 38.0-38.9, adult: Secondary | ICD-10-CM | POA: Diagnosis not present

## 2020-12-31 DIAGNOSIS — E669 Obesity, unspecified: Secondary | ICD-10-CM | POA: Diagnosis not present

## 2020-12-31 DIAGNOSIS — R600 Localized edema: Secondary | ICD-10-CM | POA: Diagnosis not present

## 2020-12-31 DIAGNOSIS — I872 Venous insufficiency (chronic) (peripheral): Secondary | ICD-10-CM | POA: Diagnosis not present

## 2021-01-07 DIAGNOSIS — E785 Hyperlipidemia, unspecified: Secondary | ICD-10-CM | POA: Diagnosis not present

## 2021-01-07 DIAGNOSIS — R600 Localized edema: Secondary | ICD-10-CM | POA: Diagnosis not present

## 2021-01-07 DIAGNOSIS — Z6838 Body mass index (BMI) 38.0-38.9, adult: Secondary | ICD-10-CM | POA: Diagnosis not present

## 2021-01-07 DIAGNOSIS — L97812 Non-pressure chronic ulcer of other part of right lower leg with fat layer exposed: Secondary | ICD-10-CM | POA: Diagnosis not present

## 2021-01-07 DIAGNOSIS — Z86718 Personal history of other venous thrombosis and embolism: Secondary | ICD-10-CM | POA: Diagnosis not present

## 2021-01-07 DIAGNOSIS — E669 Obesity, unspecified: Secondary | ICD-10-CM | POA: Diagnosis not present

## 2021-01-07 DIAGNOSIS — L97919 Non-pressure chronic ulcer of unspecified part of right lower leg with unspecified severity: Secondary | ICD-10-CM | POA: Diagnosis not present

## 2021-01-07 DIAGNOSIS — Z794 Long term (current) use of insulin: Secondary | ICD-10-CM | POA: Diagnosis not present

## 2021-01-07 DIAGNOSIS — E11622 Type 2 diabetes mellitus with other skin ulcer: Secondary | ICD-10-CM | POA: Diagnosis not present

## 2021-01-07 DIAGNOSIS — I872 Venous insufficiency (chronic) (peripheral): Secondary | ICD-10-CM | POA: Diagnosis not present

## 2021-01-14 DIAGNOSIS — Z6838 Body mass index (BMI) 38.0-38.9, adult: Secondary | ICD-10-CM | POA: Diagnosis not present

## 2021-01-14 DIAGNOSIS — I872 Venous insufficiency (chronic) (peripheral): Secondary | ICD-10-CM | POA: Diagnosis not present

## 2021-01-14 DIAGNOSIS — E11622 Type 2 diabetes mellitus with other skin ulcer: Secondary | ICD-10-CM | POA: Diagnosis not present

## 2021-01-14 DIAGNOSIS — L97812 Non-pressure chronic ulcer of other part of right lower leg with fat layer exposed: Secondary | ICD-10-CM | POA: Diagnosis not present

## 2021-01-14 DIAGNOSIS — Z86718 Personal history of other venous thrombosis and embolism: Secondary | ICD-10-CM | POA: Diagnosis not present

## 2021-01-14 DIAGNOSIS — R600 Localized edema: Secondary | ICD-10-CM | POA: Diagnosis not present

## 2021-01-14 DIAGNOSIS — E669 Obesity, unspecified: Secondary | ICD-10-CM | POA: Diagnosis not present

## 2021-01-14 DIAGNOSIS — E785 Hyperlipidemia, unspecified: Secondary | ICD-10-CM | POA: Diagnosis not present

## 2021-01-14 DIAGNOSIS — Z794 Long term (current) use of insulin: Secondary | ICD-10-CM | POA: Diagnosis not present

## 2021-01-21 DIAGNOSIS — E11622 Type 2 diabetes mellitus with other skin ulcer: Secondary | ICD-10-CM | POA: Diagnosis not present

## 2021-01-21 DIAGNOSIS — E669 Obesity, unspecified: Secondary | ICD-10-CM | POA: Diagnosis not present

## 2021-01-21 DIAGNOSIS — Z6838 Body mass index (BMI) 38.0-38.9, adult: Secondary | ICD-10-CM | POA: Diagnosis not present

## 2021-01-21 DIAGNOSIS — E785 Hyperlipidemia, unspecified: Secondary | ICD-10-CM | POA: Diagnosis not present

## 2021-01-21 DIAGNOSIS — Z86718 Personal history of other venous thrombosis and embolism: Secondary | ICD-10-CM | POA: Diagnosis not present

## 2021-01-21 DIAGNOSIS — I872 Venous insufficiency (chronic) (peripheral): Secondary | ICD-10-CM | POA: Diagnosis not present

## 2021-01-21 DIAGNOSIS — L97812 Non-pressure chronic ulcer of other part of right lower leg with fat layer exposed: Secondary | ICD-10-CM | POA: Diagnosis not present

## 2021-01-21 DIAGNOSIS — R600 Localized edema: Secondary | ICD-10-CM | POA: Diagnosis not present

## 2021-01-21 DIAGNOSIS — Z794 Long term (current) use of insulin: Secondary | ICD-10-CM | POA: Diagnosis not present

## 2021-01-27 DIAGNOSIS — I872 Venous insufficiency (chronic) (peripheral): Secondary | ICD-10-CM | POA: Diagnosis not present

## 2021-01-28 DIAGNOSIS — L97919 Non-pressure chronic ulcer of unspecified part of right lower leg with unspecified severity: Secondary | ICD-10-CM | POA: Diagnosis not present

## 2021-01-28 DIAGNOSIS — L97812 Non-pressure chronic ulcer of other part of right lower leg with fat layer exposed: Secondary | ICD-10-CM | POA: Diagnosis not present

## 2021-01-28 DIAGNOSIS — I872 Venous insufficiency (chronic) (peripheral): Secondary | ICD-10-CM | POA: Diagnosis not present

## 2021-01-28 DIAGNOSIS — Z794 Long term (current) use of insulin: Secondary | ICD-10-CM | POA: Diagnosis not present

## 2021-01-28 DIAGNOSIS — R600 Localized edema: Secondary | ICD-10-CM | POA: Diagnosis not present

## 2021-01-28 DIAGNOSIS — Z86718 Personal history of other venous thrombosis and embolism: Secondary | ICD-10-CM | POA: Diagnosis not present

## 2021-01-28 DIAGNOSIS — E669 Obesity, unspecified: Secondary | ICD-10-CM | POA: Diagnosis not present

## 2021-01-28 DIAGNOSIS — E785 Hyperlipidemia, unspecified: Secondary | ICD-10-CM | POA: Diagnosis not present

## 2021-01-28 DIAGNOSIS — Z6838 Body mass index (BMI) 38.0-38.9, adult: Secondary | ICD-10-CM | POA: Diagnosis not present

## 2021-01-28 DIAGNOSIS — E11622 Type 2 diabetes mellitus with other skin ulcer: Secondary | ICD-10-CM | POA: Diagnosis not present

## 2021-02-04 DIAGNOSIS — E669 Obesity, unspecified: Secondary | ICD-10-CM | POA: Diagnosis not present

## 2021-02-04 DIAGNOSIS — Z7902 Long term (current) use of antithrombotics/antiplatelets: Secondary | ICD-10-CM | POA: Diagnosis not present

## 2021-02-04 DIAGNOSIS — Z6838 Body mass index (BMI) 38.0-38.9, adult: Secondary | ICD-10-CM | POA: Diagnosis not present

## 2021-02-04 DIAGNOSIS — Z794 Long term (current) use of insulin: Secondary | ICD-10-CM | POA: Diagnosis not present

## 2021-02-04 DIAGNOSIS — I872 Venous insufficiency (chronic) (peripheral): Secondary | ICD-10-CM | POA: Diagnosis not present

## 2021-02-04 DIAGNOSIS — Z86718 Personal history of other venous thrombosis and embolism: Secondary | ICD-10-CM | POA: Diagnosis not present

## 2021-02-04 DIAGNOSIS — R600 Localized edema: Secondary | ICD-10-CM | POA: Diagnosis not present

## 2021-02-04 DIAGNOSIS — L97812 Non-pressure chronic ulcer of other part of right lower leg with fat layer exposed: Secondary | ICD-10-CM | POA: Diagnosis not present

## 2021-02-04 DIAGNOSIS — Z7901 Long term (current) use of anticoagulants: Secondary | ICD-10-CM | POA: Diagnosis not present

## 2021-02-04 DIAGNOSIS — E11622 Type 2 diabetes mellitus with other skin ulcer: Secondary | ICD-10-CM | POA: Diagnosis not present

## 2021-02-04 DIAGNOSIS — E785 Hyperlipidemia, unspecified: Secondary | ICD-10-CM | POA: Diagnosis not present

## 2021-02-04 DIAGNOSIS — Z7984 Long term (current) use of oral hypoglycemic drugs: Secondary | ICD-10-CM | POA: Diagnosis not present

## 2021-02-09 DIAGNOSIS — L97919 Non-pressure chronic ulcer of unspecified part of right lower leg with unspecified severity: Secondary | ICD-10-CM | POA: Diagnosis not present

## 2021-02-18 DIAGNOSIS — E669 Obesity, unspecified: Secondary | ICD-10-CM | POA: Diagnosis not present

## 2021-02-18 DIAGNOSIS — Z7901 Long term (current) use of anticoagulants: Secondary | ICD-10-CM | POA: Diagnosis not present

## 2021-02-18 DIAGNOSIS — R600 Localized edema: Secondary | ICD-10-CM | POA: Diagnosis not present

## 2021-02-18 DIAGNOSIS — Z7982 Long term (current) use of aspirin: Secondary | ICD-10-CM | POA: Diagnosis not present

## 2021-02-18 DIAGNOSIS — Z86718 Personal history of other venous thrombosis and embolism: Secondary | ICD-10-CM | POA: Diagnosis not present

## 2021-02-18 DIAGNOSIS — Z794 Long term (current) use of insulin: Secondary | ICD-10-CM | POA: Diagnosis not present

## 2021-02-18 DIAGNOSIS — E11622 Type 2 diabetes mellitus with other skin ulcer: Secondary | ICD-10-CM | POA: Diagnosis not present

## 2021-02-18 DIAGNOSIS — Z7984 Long term (current) use of oral hypoglycemic drugs: Secondary | ICD-10-CM | POA: Diagnosis not present

## 2021-02-18 DIAGNOSIS — Z6838 Body mass index (BMI) 38.0-38.9, adult: Secondary | ICD-10-CM | POA: Diagnosis not present

## 2021-02-18 DIAGNOSIS — L97812 Non-pressure chronic ulcer of other part of right lower leg with fat layer exposed: Secondary | ICD-10-CM | POA: Diagnosis not present

## 2021-02-18 DIAGNOSIS — L97919 Non-pressure chronic ulcer of unspecified part of right lower leg with unspecified severity: Secondary | ICD-10-CM | POA: Diagnosis not present

## 2021-02-18 DIAGNOSIS — I872 Venous insufficiency (chronic) (peripheral): Secondary | ICD-10-CM | POA: Diagnosis not present

## 2021-02-18 DIAGNOSIS — E785 Hyperlipidemia, unspecified: Secondary | ICD-10-CM | POA: Diagnosis not present

## 2021-03-18 DIAGNOSIS — Z7982 Long term (current) use of aspirin: Secondary | ICD-10-CM | POA: Diagnosis not present

## 2021-03-18 DIAGNOSIS — Z6838 Body mass index (BMI) 38.0-38.9, adult: Secondary | ICD-10-CM | POA: Diagnosis not present

## 2021-03-18 DIAGNOSIS — Z794 Long term (current) use of insulin: Secondary | ICD-10-CM | POA: Diagnosis not present

## 2021-03-18 DIAGNOSIS — R600 Localized edema: Secondary | ICD-10-CM | POA: Diagnosis not present

## 2021-03-18 DIAGNOSIS — Z86718 Personal history of other venous thrombosis and embolism: Secondary | ICD-10-CM | POA: Diagnosis not present

## 2021-03-18 DIAGNOSIS — L97812 Non-pressure chronic ulcer of other part of right lower leg with fat layer exposed: Secondary | ICD-10-CM | POA: Diagnosis not present

## 2021-03-18 DIAGNOSIS — E669 Obesity, unspecified: Secondary | ICD-10-CM | POA: Diagnosis not present

## 2021-03-18 DIAGNOSIS — E11622 Type 2 diabetes mellitus with other skin ulcer: Secondary | ICD-10-CM | POA: Diagnosis not present

## 2021-03-18 DIAGNOSIS — Z7984 Long term (current) use of oral hypoglycemic drugs: Secondary | ICD-10-CM | POA: Diagnosis not present

## 2021-03-18 DIAGNOSIS — L97919 Non-pressure chronic ulcer of unspecified part of right lower leg with unspecified severity: Secondary | ICD-10-CM | POA: Diagnosis not present

## 2021-03-18 DIAGNOSIS — I872 Venous insufficiency (chronic) (peripheral): Secondary | ICD-10-CM | POA: Diagnosis not present

## 2021-03-18 DIAGNOSIS — Z7901 Long term (current) use of anticoagulants: Secondary | ICD-10-CM | POA: Diagnosis not present
